# Patient Record
Sex: Female | Born: 1980 | Race: Black or African American | Hispanic: No | Marital: Married | Smoking: Never smoker
Health system: Southern US, Community
[De-identification: ages and names within clinical notes are randomized; demographics above are authoritative.]

## PROBLEM LIST (undated history)

## (undated) DIAGNOSIS — Z8759 Personal history of other complications of pregnancy, childbirth and the puerperium: Secondary | ICD-10-CM

## (undated) DIAGNOSIS — O24419 Gestational diabetes mellitus in pregnancy, unspecified control: Secondary | ICD-10-CM

## (undated) DIAGNOSIS — D509 Iron deficiency anemia, unspecified: Secondary | ICD-10-CM

## (undated) DIAGNOSIS — N920 Excessive and frequent menstruation with regular cycle: Secondary | ICD-10-CM

## (undated) DIAGNOSIS — E119 Type 2 diabetes mellitus without complications: Secondary | ICD-10-CM

## (undated) DIAGNOSIS — G4700 Insomnia, unspecified: Secondary | ICD-10-CM

## (undated) DIAGNOSIS — R000 Tachycardia, unspecified: Secondary | ICD-10-CM

## (undated) DIAGNOSIS — Z973 Presence of spectacles and contact lenses: Secondary | ICD-10-CM

## (undated) DIAGNOSIS — O139 Gestational [pregnancy-induced] hypertension without significant proteinuria, unspecified trimester: Secondary | ICD-10-CM

## (undated) DIAGNOSIS — N644 Mastodynia: Secondary | ICD-10-CM

## (undated) DIAGNOSIS — K219 Gastro-esophageal reflux disease without esophagitis: Secondary | ICD-10-CM

## (undated) DIAGNOSIS — O34219 Maternal care for unspecified type scar from previous cesarean delivery: Secondary | ICD-10-CM

## (undated) DIAGNOSIS — L309 Dermatitis, unspecified: Secondary | ICD-10-CM

## (undated) DIAGNOSIS — I10 Essential (primary) hypertension: Secondary | ICD-10-CM

## (undated) DIAGNOSIS — Z8632 Personal history of gestational diabetes: Secondary | ICD-10-CM

## (undated) HISTORY — DX: Type 2 diabetes mellitus without complications: E11.9

## (undated) HISTORY — DX: Tachycardia, unspecified: R00.0

## (undated) HISTORY — PX: WISDOM TOOTH EXTRACTION: SHX21

## (undated) HISTORY — PX: CYST REMOVAL TRUNK: SHX6283

---

## 2012-04-08 LAB — OB RESULTS CONSOLE RUBELLA ANTIBODY, IGM: Rubella: IMMUNE

## 2012-04-08 LAB — OB RESULTS CONSOLE ANTIBODY SCREEN: Antibody Screen: NEGATIVE

## 2012-04-08 LAB — OB RESULTS CONSOLE HIV ANTIBODY (ROUTINE TESTING): HIV: NONREACTIVE

## 2012-04-22 LAB — OB RESULTS CONSOLE GC/CHLAMYDIA: Chlamydia: NEGATIVE

## 2012-06-05 ENCOUNTER — Encounter: Attending: Obstetrics and Gynecology | Admitting: *Deleted

## 2012-06-05 VITALS — Ht 70.0 in | Wt 159.1 lb

## 2012-06-05 DIAGNOSIS — O99810 Abnormal glucose complicating pregnancy: Secondary | ICD-10-CM

## 2012-06-05 DIAGNOSIS — Z713 Dietary counseling and surveillance: Secondary | ICD-10-CM | POA: Insufficient documentation

## 2012-06-06 ENCOUNTER — Encounter: Payer: Self-pay | Admitting: *Deleted

## 2012-06-06 NOTE — Progress Notes (Signed)
  Patient was seen on 06/05/12 for Gestational Diabetes self-management class at the [facility]. The following learning objectives were met by the patient during this course:   States the definition of Gestational Diabetes  States why dietary management is important in controlling blood glucose  Describes the effects each nutrient has on blood glucose levels  Demonstrates ability to create a balanced meal plan  Demonstrates carbohydrate counting   States when to check blood glucose levels  Demonstrates proper blood glucose monitoring techniques  States the effect of stress and exercise on blood glucose levels  States the importance of limiting caffeine and abstaining from alcohol and smoking  Blood glucose monitor given: Accu Chek Nano BG Monitoring Kit Lot # [id number] Exp: 07-27-13 Blood glucose reading: 93 mg/dL  Patient instructed to monitor glucose levels: FBS: 60 - <90 2 hour: <120  *Patient received handouts:  Nutrition Diabetes and Pregnancy  Carbohydrate Counting List  Patient will be seen for follow-up as needed.

## 2012-06-06 NOTE — Patient Instructions (Signed)
Goals:  Check glucose levels per MD as instructed  Follow Gestational Diabetes Diet as instructed  Call for follow-up as needed    

## 2012-08-28 HISTORY — DX: Maternal care for unspecified type scar from previous cesarean delivery: O34.219

## 2012-08-28 NOTE — L&D Delivery Note (Signed)
Delivery Note At 1:54 AM a viable and healthy female was delivered via Vaginal, Spontaneous Delivery (Presentation: Right Occiput Anterior).  APGAR: 6, 7; weight 4 lb 15 oz (2240 g).   Placenta status: Intact, Spontaneous.  Sent to path for IUGR Cord: 3 vessels with the following complications: None. Cord around right leg  Cord pH: none  Anesthesia:  epidural Episiotomy: none Lacerations: right labial minora Suture Repair: 3.0 chromic Est. Blood Loss (mL): 300  Mom to postpartum.  Baby to nursery-stable.  Bolden Hagerman A 10/22/2012, 2:27 AM

## 2012-09-28 ENCOUNTER — Inpatient Hospital Stay (HOSPITAL_COMMUNITY): Admission: AD | Admit: 2012-09-28 | Source: Ambulatory Visit | Admitting: Obstetrics and Gynecology

## 2012-09-30 ENCOUNTER — Encounter (HOSPITAL_COMMUNITY): Payer: Self-pay

## 2012-09-30 ENCOUNTER — Inpatient Hospital Stay (HOSPITAL_COMMUNITY)
Admission: AD | Admit: 2012-09-30 | Discharge: 2012-10-24 | DRG: 774 | Disposition: A | Discharge status: Home / Self Care | Source: Ambulatory Visit | Attending: Obstetrics & Gynecology | Admitting: Obstetrics & Gynecology

## 2012-09-30 DIAGNOSIS — B373 Candidiasis of vulva and vagina: Secondary | ICD-10-CM | POA: Diagnosis not present

## 2012-09-30 DIAGNOSIS — O1490 Unspecified pre-eclampsia, unspecified trimester: Secondary | ICD-10-CM

## 2012-09-30 DIAGNOSIS — IMO0002 Reserved for concepts with insufficient information to code with codable children: Secondary | ICD-10-CM | POA: Diagnosis present

## 2012-09-30 DIAGNOSIS — O1493 Unspecified pre-eclampsia, third trimester: Secondary | ICD-10-CM | POA: Diagnosis present

## 2012-09-30 DIAGNOSIS — O34219 Maternal care for unspecified type scar from previous cesarean delivery: Secondary | ICD-10-CM | POA: Diagnosis not present

## 2012-09-30 DIAGNOSIS — O99814 Abnormal glucose complicating childbirth: Principal | ICD-10-CM | POA: Diagnosis present

## 2012-09-30 DIAGNOSIS — O24410 Gestational diabetes mellitus in pregnancy, diet controlled: Secondary | ICD-10-CM | POA: Diagnosis present

## 2012-09-30 DIAGNOSIS — O365990 Maternal care for other known or suspected poor fetal growth, unspecified trimester, not applicable or unspecified: Secondary | ICD-10-CM | POA: Diagnosis present

## 2012-09-30 DIAGNOSIS — B3731 Acute candidiasis of vulva and vagina: Secondary | ICD-10-CM | POA: Diagnosis not present

## 2012-09-30 DIAGNOSIS — O2390 Unspecified genitourinary tract infection in pregnancy, unspecified trimester: Secondary | ICD-10-CM | POA: Diagnosis not present

## 2012-09-30 HISTORY — DX: Gestational (pregnancy-induced) hypertension without significant proteinuria, unspecified trimester: O13.9

## 2012-09-30 HISTORY — DX: Gestational diabetes mellitus in pregnancy, unspecified control: O24.419

## 2012-09-30 LAB — RPR: RPR Ser Ql: NONREACTIVE

## 2012-09-30 LAB — CBC
MCH: 29.5 pg (ref 26.0–34.0)
MCHC: 34.6 g/dL (ref 30.0–36.0)
MCV: 85 fL (ref 78.0–100.0)
Platelets: 166 10*3/uL (ref 150–400)
RDW: 13.8 % (ref 11.5–15.5)

## 2012-09-30 LAB — GLUCOSE, CAPILLARY

## 2012-09-30 LAB — URINALYSIS, ROUTINE W REFLEX MICROSCOPIC
Glucose, UA: NEGATIVE mg/dL
Leukocytes, UA: NEGATIVE
Protein, ur: 100 mg/dL — AB
Specific Gravity, Urine: 1.02 (ref 1.005–1.030)
Urobilinogen, UA: 0.2 mg/dL (ref 0.0–1.0)

## 2012-09-30 LAB — COMPREHENSIVE METABOLIC PANEL
AST: 19 U/L (ref 0–37)
Albumin: 2.2 g/dL — ABNORMAL LOW (ref 3.5–5.2)
BUN: 6 mg/dL (ref 6–23)
Calcium: 9 mg/dL (ref 8.4–10.5)
Creatinine, Ser: 0.48 mg/dL — ABNORMAL LOW (ref 0.50–1.10)

## 2012-09-30 LAB — URIC ACID: Uric Acid, Serum: 5.4 mg/dL (ref 2.4–7.0)

## 2012-09-30 LAB — OB RESULTS CONSOLE GBS: GBS: NEGATIVE

## 2012-09-30 MED ORDER — PRENATAL MULTIVITAMIN CH
1.0000 | ORAL_TABLET | Freq: Every day | ORAL | Status: DC
  Administered 2012-09-30 – 2012-10-20 (×21): 1 via ORAL
  Filled 2012-09-30 (×22): qty 1

## 2012-09-30 MED ORDER — MAGNESIUM SULFATE BOLUS VIA INFUSION
6.0000 g | Freq: Once | INTRAVENOUS | Status: AC
  Administered 2012-09-30: 6 g via INTRAVENOUS
  Filled 2012-09-30: qty 500

## 2012-09-30 MED ORDER — DEXTROSE 5 % IV SOLN
500.0000 mg | INTRAVENOUS | Status: DC
  Filled 2012-09-30: qty 500

## 2012-09-30 MED ORDER — INSULIN ASPART 100 UNIT/ML ~~LOC~~ SOLN
7.0000 [IU] | Freq: Every day | SUBCUTANEOUS | Status: DC
  Administered 2012-09-30 – 2012-10-11 (×12): 7 [IU] via SUBCUTANEOUS
  Administered 2012-10-12: 100 [IU] via SUBCUTANEOUS
  Administered 2012-10-13 – 2012-10-20 (×8): 7 [IU] via SUBCUTANEOUS

## 2012-09-30 MED ORDER — AZITHROMYCIN 500 MG PO TABS: 500.0000 mg | ORAL_TABLET | Freq: Every day | ORAL | Status: DC

## 2012-09-30 MED ORDER — SODIUM CHLORIDE 0.9 % IV SOLN: 250.0000 mL | INTRAVENOUS | Status: DC | PRN

## 2012-09-30 MED ORDER — INSULIN ASPART 100 UNIT/ML ~~LOC~~ SOLN
9.0000 [IU] | Freq: Every day | SUBCUTANEOUS | Status: DC
  Administered 2012-09-30 – 2012-10-06 (×7): 9 [IU] via SUBCUTANEOUS
  Administered 2012-10-07: 13:00:00 via SUBCUTANEOUS
  Administered 2012-10-08 – 2012-10-20 (×13): 9 [IU] via SUBCUTANEOUS

## 2012-09-30 MED ORDER — LABETALOL HCL 100 MG PO TABS
100.0000 mg | ORAL_TABLET | Freq: Two times a day (BID) | ORAL | Status: DC
  Administered 2012-09-30 – 2012-10-09 (×19): 100 mg via ORAL
  Filled 2012-09-30 (×19): qty 1

## 2012-09-30 MED ORDER — DEXTROSE 5 % IV SOLN
2.5000 10*6.[IU] | INTRAVENOUS | Status: DC
  Filled 2012-09-30: qty 2.5

## 2012-09-30 MED ORDER — PENICILLIN G POTASSIUM 5000000 UNITS IJ SOLR
5.0000 10*6.[IU] | Freq: Once | INTRAVENOUS | Status: DC
  Filled 2012-09-30: qty 5

## 2012-09-30 MED ORDER — CALCIUM CARBONATE ANTACID 500 MG PO CHEW: 2.0000 | CHEWABLE_TABLET | ORAL | Status: DC | PRN

## 2012-09-30 MED ORDER — DOCUSATE SODIUM 100 MG PO CAPS
100.0000 mg | ORAL_CAPSULE | Freq: Every day | ORAL | Status: DC
  Administered 2012-09-30 – 2012-10-20 (×12): 100 mg via ORAL
  Filled 2012-09-30 (×16): qty 1

## 2012-09-30 MED ORDER — MAGNESIUM SULFATE 40 G IN LACTATED RINGERS - SIMPLE
2.0000 g/h | INTRAVENOUS | Status: DC
  Administered 2012-10-01: 2 g/h via INTRAVENOUS
  Filled 2012-09-30 (×2): qty 500

## 2012-09-30 MED ORDER — HEPARIN SODIUM (PORCINE) 5000 UNIT/ML IJ SOLN
5000.0000 [IU] | Freq: Three times a day (TID) | INTRAMUSCULAR | Status: DC
  Administered 2012-09-30 – 2012-10-20 (×60): 5000 [IU] via SUBCUTANEOUS
  Filled 2012-09-30 (×65): qty 1

## 2012-09-30 MED ORDER — LABETALOL HCL 5 MG/ML IV SOLN
10.0000 mg | Freq: Once | INTRAVENOUS | Status: AC
  Administered 2012-09-30: 10 mg via INTRAVENOUS
  Filled 2012-09-30: qty 4

## 2012-09-30 MED ORDER — LACTATED RINGERS IV SOLN
INTRAVENOUS | Status: DC
  Administered 2012-09-30 – 2012-10-01 (×3): via INTRAVENOUS

## 2012-09-30 MED ORDER — INSULIN ASPART 100 UNIT/ML ~~LOC~~ SOLN
5.0000 [IU] | Freq: Every day | SUBCUTANEOUS | Status: DC
  Administered 2012-10-01 – 2012-10-20 (×20): 5 [IU] via SUBCUTANEOUS

## 2012-09-30 MED ORDER — SODIUM CHLORIDE 0.9 % IJ SOLN: 3.0000 mL | INTRAMUSCULAR | Status: DC | PRN

## 2012-09-30 MED ORDER — ACETAMINOPHEN 325 MG PO TABS
650.0000 mg | ORAL_TABLET | ORAL | Status: DC | PRN
  Administered 2012-09-30 – 2012-10-03 (×4): 650 mg via ORAL
  Filled 2012-09-30 (×4): qty 2

## 2012-09-30 MED ORDER — INSULIN NPH (HUMAN) (ISOPHANE) 100 UNIT/ML ~~LOC~~ SUSP
24.0000 [IU] | Freq: Every day | SUBCUTANEOUS | Status: DC
  Administered 2012-09-30 – 2012-10-08 (×9): 24 [IU] via SUBCUTANEOUS
  Filled 2012-09-30: qty 10

## 2012-09-30 MED ORDER — ZOLPIDEM TARTRATE 5 MG PO TABS
5.0000 mg | ORAL_TABLET | Freq: Every evening | ORAL | Status: DC | PRN
  Administered 2012-09-30 – 2012-10-20 (×4): 5 mg via ORAL
  Filled 2012-09-30 (×5): qty 1

## 2012-09-30 MED ORDER — SODIUM CHLORIDE 0.9 % IJ SOLN: 3.0000 mL | Freq: Two times a day (BID) | INTRAMUSCULAR | Status: DC

## 2012-09-30 NOTE — H&P (Signed)
Connie [name] is a 32 y.o. female presenting for  admission due to elevated BP and proteinuria @ [redacted] wk gestation. Pt was seen in office today for routine care. Pt c/o leg swelling and had a 5 lb weight gain in 6 days. Pt denies any h/a or epigastric pain. Previous hx notable for severe PEC, Class a1 GDM @ 27 5/7 weeks with delivery by C/S. [facility] complicated by Class A2 GDM on insulin. Pt noted to have elevated BP on last office visit  and had PIH labs which were normal.  Pt was instructed to do 24 hr UTP/ crCL collection which today showed 24 hr UTP of 1546mg    Maternal Medical History:  Fetal activity: Perceived fetal activity is normal.    Prenatal complications: Pre-eclampsia.     OB History    Grav Para Term Preterm Abortions TAB SAB Ect Mult Living   2 1  1      1      Past Medical History  Diagnosis Date  . Diabetes mellitus without complication   . Gestational diabetes   . Pregnancy induced hypertension    Past Surgical History  Procedure Date  . Cesarean section   . Cyst removal trunk    Family History: family history is negative for Other. Social History:  reports that she has never smoked. She does not have any smokeless tobacco history on file. She reports that she does not drink alcohol or use illicit drugs.   Prenatal Transfer Tool  Maternal Diabetes: Yes:  Diabetes Type:  Insulin/Medication controlled Genetic Screening: Normal Maternal Ultrasounds/Referrals: Normal Fetal Ultrasounds or other Referrals:  Fetal echonormal Maternal Substance Abuse:  No Significant Maternal Medications:  Meds include: Other: insulin Significant Maternal Lab Results:  None Other Comments:  None  Review of Systems  All other systems reviewed and are negative.   leg swelling. H./a resolved now  Dilation:  (deferred, sent from office) Blood pressure 168/99, pulse 75, temperature 97.9 F (36.6 C), temperature source Oral, resp. rate 16, height 5' 10.5" (1.791 m), weight 81.738 kg  (180 lb 3.2 oz), SpO2 100.00%. Maternal Exam:  Abdomen: Patient reports no abdominal tenderness. Surgical scars: low transverse.   Fetal presentation: vertex  Introitus: Normal vulva. Pelvis: adequate for delivery.      Fetal Exam Fetal Monitor Review: Mode: hand-held doppler probe.   Variability: minimal (<5 bpm).      Tracing: baseline 150  Min variability. Small accels to 160 NR Physical Exam  Constitutional: She is oriented to person, place, and time. She appears well-developed and well-nourished.  HENT:  Head: Atraumatic.  Eyes: EOM are normal.  Neck: Neck supple.  Cardiovascular: Regular rhythm.   Respiratory: Breath sounds normal.  GI: Soft.  Musculoskeletal: She exhibits edema.  Neurological: She is alert and oriented to person, place, and time. She has normal reflexes.  Skin: Skin is warm and dry.  Psychiatric: She has a normal mood and affect.    Prenatal labs: ABO, Rh:  A positive Antibody:  neg Rubella:  Immune RPR:   NR HBsAg:   neg HIV:   neg GBS:   unknown  Assessment/Plan: PIH/preeclampsia Class A2 GDM Previous C/S Hx PTB IUP @ 34 weeks P) admit. Start labetalol. Magnesium sulfate. EFW, BPP. NICU consult. MFM consult( regarding timing of delivery in light of GDM). GBS cx. BS monitoring.  ? BMZ now or closer to delivery. Bedrest.    Kanna Dafoe A 09/30/2012, 12:49 PM

## 2012-09-30 NOTE — Plan of Care (Signed)
Problem: Consults Goal: Birthing Suites Patient Information Press F2 to bring up selections list   Pt < [redacted] weeks EGA     

## 2012-09-30 NOTE — MAU Note (Signed)
Patient states she was seen in the office this am and sent to MAU for evaluation of elevated blood pressure. Patient denies bleeding, leaking or contractions. Reports good fetal movement. States she has had slight headache and swelling over the weekend.

## 2012-09-30 NOTE — Consult Note (Signed)
[facility]  Neonatal Medicine Consultation       09/30/2012    9:51 PM  I was called at the request of the patient's obstetrician (Dr. Cherly Hensen) to speak to this patient due to pregnancy-induced hypertension and insulin-dependent diabetes at [redacted] weeks gestation.  I discussed with the patient the expected outcome for 34-[redacted] week gestations, including mortality and morbidity.  We would expect the baby to survive and have a good chance at normal long-term outcome.  She had a previous 27-week delivery in [place] that required NICU stay.  I answered her questions.  _____________________ Electronically Signed By: Angelita Ingles, MD Neonatologist

## 2012-10-01 ENCOUNTER — Inpatient Hospital Stay (HOSPITAL_COMMUNITY)

## 2012-10-01 ENCOUNTER — Encounter (HOSPITAL_COMMUNITY)

## 2012-10-01 DIAGNOSIS — O24410 Gestational diabetes mellitus in pregnancy, diet controlled: Secondary | ICD-10-CM | POA: Diagnosis present

## 2012-10-01 DIAGNOSIS — O1493 Unspecified pre-eclampsia, third trimester: Secondary | ICD-10-CM | POA: Diagnosis present

## 2012-10-01 LAB — URIC ACID: Uric Acid, Serum: 5.8 mg/dL (ref 2.4–7.0)

## 2012-10-01 LAB — CBC
Hemoglobin: 11.1 g/dL — ABNORMAL LOW (ref 12.0–15.0)
MCH: 28.6 pg (ref 26.0–34.0)
MCHC: 33.1 g/dL (ref 30.0–36.0)

## 2012-10-01 LAB — COMPREHENSIVE METABOLIC PANEL
ALT: 9 U/L (ref 0–35)
BUN: 6 mg/dL (ref 6–23)
Calcium: 7.7 mg/dL — ABNORMAL LOW (ref 8.4–10.5)
Creatinine, Ser: 0.63 mg/dL (ref 0.50–1.10)
GFR calc Af Amer: >90 mL/min (ref >90)
Glucose, Bld: 124 mg/dL — ABNORMAL HIGH (ref 70–99)
Sodium: 136 mEq/L (ref 135–145)
Total Protein: 5.6 g/dL — ABNORMAL LOW (ref 6.0–8.3)

## 2012-10-01 LAB — GLUCOSE, CAPILLARY: Glucose-Capillary: 176 mg/dL — ABNORMAL HIGH (ref 70–99)

## 2012-10-01 NOTE — Progress Notes (Signed)
[facility] ultrasound  Indication: 32 yr old G23P0101 at [redacted]w[redacted]d with mild preeclampsia and gestational diabetes A2 for fetal ultrasound and consult.  Findings: 1. Single intrauterine pregnancy. 2. Estimated fetal weight is in the 30th%. 3. Posterior placenta without evidence of previa. 4. Normal amniotic fluid index. 5. The limited anatomy survey is normal. 6. Normal biophysical profile of 8/8.  Recommendations: 1. Appropriate fetal growth. 2. Normal limited anatomy survey. 3. Normal BPP. 4. Preeclampsia: - see consult note 5. gestational diabetes: - see consult note  Eulis Foster, MD

## 2012-10-01 NOTE — Consult Note (Signed)
MFM consult  32 yr old G2P0101 at 105w1d with mild preeclampsia and gestational diabetes A2 referred by Dr. Cherly Hensen for fetal ultrasound and consult.  Patient was recently admitted after having a mild range blood pressure in the office. 24 hour urine done was positive. Blood pressures initially were in mild range with 2 isolated severe range blood pressures. Patient was started on labetalol and magnesium sulfate. Patient is asymptomatic.  PMH: none Past OB hx: delivered at 27 weeks via C section for severe preeclampsia  Medication: labetalol 100mg  bid; insulin  Ultrasound today shows: estimated fetal weight is in the 30th%. Posterior placenta without evidence of previa. Normal amniotic fluid index. The limited anatomy survey is normal. Normal biophysical profile of 8/8.   Labs: normal LFTs, CBC, creatinine; 24 hour urine 1546mg   I counseled the patient as follows: 1. Appropriate fetal growth.  2. Normal limited anatomy survey.  3. Normal BPP.  4. Preeclampsia:  - at this point patient has mild preeclampsia- did have 2 severe range blood pressures but not separated by 6 hours; patient was then started on labetalol so this confuses the picture somewhat - however, given blood pressures in the last 24 hours have been normal to mild range do not feel that delivery is indicated at this time - expectant management is recommended; however, given history and patient's episode of severe range blood pressure recommend management as an inpatient until delivery - Discussed risks of preeclampsia include: risk of fetal growth restriction, abruption, stillbirth, and risk of severe preeclampsia/eclampsia (15-25% risk) - recommend close surveillance for the development of signs/symptoms of preeclampsia- currently asymptomatic today - recommend twice weekly lab studies: CBC, AST, ALT, uric acid, BUN, creatinine - do not recommend repeating 24 hour urine - recommend at least twice daily assessment of fetal well  being with a nonstress test - recommend delivery at [redacted] weeks gestation or sooner if there is nonreassuring fetal testing, or if patient has any signs/symptoms of severe preeclampsia (if meets that criteria recommend immediate delivery) - recommend close monitoring of blood pressures; can continue labetalol and recommend do not titrate medication further and deliver for persistent severe range blood pressures (SBP greater than or equal to 160 or DBP greater than or equal to 110) or sooner if has symptoms, nonreassuring fetal testing, abnormal labs, or meets severe criteria - fetus appropriately grown on today's ultrasound  - would recommend magnesium sulfate for seizure prophylaxis during labor and 24 hours postpartum if patient meets criteria for severe preeclampsia. The data is more mixed on the need for magnesium sulfate for seizure prophylaxis for mild preeclampsia usually my recommendation is for treatment with magnesium sulfate for seizure prophylaxis for patients with severe preeclampsia. - at this time given patient is [redacted]w[redacted]d would recommend course of betamethasone; with careful monitoring of blood sugars - do not feel patient needs to be on magnesium sulfate at this time given 24 hours have passed and patient is stable  5. gestational diabetes:  Discussed increased risks in pregnancy include: fetal macrosomia, shoulder dystocia, and increased risk of requiring a Cesarean delivery. There is also an increased risk of developing preeclampsia during the pregnancy and an increased risk of type II diabetes in the future. I discussed there is an increased risk of stillbirth, neonatal hypoglycemia, neonatal jaundice, and neonatal electrolyte disturbances. I recommend strict glucose control maintaining fasting blood sugars <90 and 2 hour postprandial values <120.  Insulin may need to be adjusted after giving betamethasone. I recommend screening for diabetes 6 weeks  postpartum.  If patient plans future  pregnancies would recommend treatment with low dose aspirin 81mg  starting in the first or early second trimester as use in high risk patients has been shown to reduce risk of preeclampsia in future pregnancies.  I discussed the above with Dr. Cherly Hensen.  I spent 40 minutes in face to face consultation with the patient in addition to time spent on the ultrasound.  Eulis Foster, MD

## 2012-10-01 NOTE — Progress Notes (Signed)
Day 2 34 1/[redacted] weeks gestation Preeclampsia Hx PTB Class A2 GDM Previous C/S  S: no complaints. Notes leg swelling down. Denies h/a, visual changes or epigastric pain  VS: BP 128/81 (BP 113-147/81-95) Lungs clear to A Cor RRR Abd: gravid, nontender pfannensteil Pelvic deferred BS 71-90 Tracing: baseline 140-145 small accels (+) 150 (-0ctx  IMP: Class A2 GDM in insulin w/ better glucose numbers due to prob less dietary indiscretion Preeclampsia on labetalol, Magnesium Hx PTB Prev LTCS IUP @ 34 1/7 weeks  P)d/c magnesium. Hold on betamethasone. Await EFW, BPP, MFM input. Cont labetalol, repeat PIH labs today

## 2012-10-01 NOTE — Progress Notes (Signed)
Antenatal Nutrition Assessment:  Currently  34 1/[redacted] weeks gestation, with preeclampsia, IDGDM. Height  70" Weight 180 Lbs pre-pregnancy weight 145 Lbs.Pre-pregnancy  BMI 20.9  IBW 150 Lbs  Total weight gain 35 Lbs. Weight gain goals 25-35 Lbs.   Estimated needs: 2000-2200 kcal/day, 72-82 grams protein/day, 2.3  liters fluid/day  Carbohydrate modified gestational  diet  Current diet prescription will provide for increased needs.  Nutrition related labs.  CBG (last 3)   Basename 10/01/12 1103 10/01/12 0632 09/30/12 2118  GLUCAP 176* 90 97     Nutrition Dx: Increased nutrient needs r/t pregnancy and fetal growth requirements aeb [redacted] weeks gestation.  No educational needs assessed at this time. Pt is followed by Dr. Talmage Nap, Education at [facility] on 06/15/12  Inez Pilgrim.Odis Luster LDN Neonatal Nutrition Support Specialist Pager [phone]

## 2012-10-02 LAB — GLUCOSE, CAPILLARY: Glucose-Capillary: 117 mg/dL — ABNORMAL HIGH (ref 70–99)

## 2012-10-02 NOTE — Progress Notes (Signed)
34 2/7 weeks   D3 Preeclampsia on labetalol Hx PTB Previous C/S Class A2 GDM on insulin  S: notes no further leg swelling (+) FM (-) h/a or epigastric pain VS BP 134/78 Afebrile (BP 123/91- 157/89) Lungs clear to A Cor RRR Abd gravid, soft nontender Pelvic deferred Extr> no edema or calf tenderness  sono ( 2/4) 4lb 7 oz( 30%)  Nl AFI BPP 8/8 Tracing: baseline 135-140 (+) accels  GBS cx pending.  FBS 84, 2hr  Postprandial 117  IMP: Mild preeclampsia s/p magnesium now on labetalol Previous C/S desires VBAC Hx PTB 2nd to severe preeclampsia Class A2GDM on insulin controlled IUP 2 34 2/7 w/ adequate growth  P) cont inpt status per MFM. Repeat 24 hr UTP/ CRCL Fri, PIH labs Friday. Cont BS check GBS cx

## 2012-10-02 NOTE — Progress Notes (Signed)
Patient up to bathroom, then sitting in chair.

## 2012-10-03 LAB — GLUCOSE, CAPILLARY
Glucose-Capillary: 106 mg/dL — ABNORMAL HIGH (ref 70–99)
Glucose-Capillary: 79 mg/dL (ref 70–99)
Glucose-Capillary: 80 mg/dL (ref 70–99)

## 2012-10-03 LAB — CULTURE, BETA STREP (GROUP B ONLY)

## 2012-10-03 MED ORDER — SODIUM CHLORIDE 0.9 % IJ SOLN
3.0000 mL | Freq: Two times a day (BID) | INTRAMUSCULAR | Status: DC
  Administered 2012-10-03 (×3): 3 mL via INTRAVENOUS

## 2012-10-03 NOTE — Progress Notes (Signed)
S: no complaint. Active fetus Denies h/a, visual changes or epigastric pain Disc need for protein intake  O: VS afebrile BP 142/84  ( BP 142-151/64-103) Lungs clear to A Cor RRR Abd gravid nontender Pelvic deferred Extr (-) edema  Or clonus  Tracing: baseline 140 (+) small accels, rare ctx BS FBS < 90, 2hr < 90  IMP: Preeclampsia on labetalol. 24 hr UTP in prog Class A2 GDM controlled on insulin Prev C/S  Desires VBAC Hx PTB IUP @ 34 3/7 P) cont present mgmt. Watch BP> await  24 hr UTP/crCL. PIH labs in am

## 2012-10-04 LAB — CBC
HCT: 36.6 % (ref 36.0–46.0)
MCH: 29.4 pg (ref 26.0–34.0)
MCV: 86.7 fL (ref 78.0–100.0)
Platelets: 159 10*3/uL (ref 150–400)
RBC: 4.22 MIL/uL (ref 3.87–5.11)

## 2012-10-04 LAB — URIC ACID: Uric Acid, Serum: 6.2 mg/dL (ref 2.4–7.0)

## 2012-10-04 LAB — COMPREHENSIVE METABOLIC PANEL
AST: 20 U/L (ref 0–37)
BUN: 10 mg/dL (ref 6–23)
CO2: 23 mEq/L (ref 19–32)
Calcium: 8.8 mg/dL (ref 8.4–10.5)
Chloride: 101 mEq/L (ref 96–112)
Creatinine, Ser: 0.68 mg/dL (ref 0.50–1.10)
GFR calc Af Amer: >90 mL/min (ref >90)
GFR calc non Af Amer: >90 mL/min (ref >90)
Total Bilirubin: 0.2 mg/dL — ABNORMAL LOW (ref 0.3–1.2)

## 2012-10-04 LAB — GLUCOSE, CAPILLARY
Glucose-Capillary: 68 mg/dL — ABNORMAL LOW (ref 70–99)
Glucose-Capillary: 105 mg/dL — ABNORMAL HIGH (ref 70–99)

## 2012-10-04 LAB — CREATININE CLEARANCE, URINE, 24 HOUR
Creatinine, Urine: 36.84 mg/dL
Creatinine: 0.63 mg/dL (ref 0.50–1.10)
Urine Total Volume-CRCL: 3425 mL

## 2012-10-04 LAB — PROTEIN, URINE, 24 HOUR
Collection Interval-UPROT: 24 hours
Protein, 24H Urine: 719 mg/d — ABNORMAL HIGH (ref 50–100)

## 2012-10-04 NOTE — Progress Notes (Signed)
Nutrition Follow-up Pt c/o being very hungry, limited protein options at breakfast that she can tolerated without nausea. CBG's have been wnl. Gave pt menu for ordering snacks, encouraged her to order double protein portions at meals and snacks to increase satiety and caloric intake. CHO limit at breakfast increased from 35 to 40 grams to allow more menu options at breakfast. CBG (last 3)   Basename 10/04/12 0602 10/03/12 2009 10/03/12 1529  GLUCAP 68* 80 76    [address] M.Odis Luster LDN Neonatal Nutrition Support Specialist Pager [phone]

## 2012-10-04 NOTE — Progress Notes (Addendum)
Patient ID: Connie [name], female   DOB: 1981-08-17, 32 y.o.   MRN: [id number] Hospital Day #4  S: Feels well.  Good FM.  Denies blurred vision, HA, epigastric pain, CP, SOB  O: A&O x 3 Lungs: CTA Heart: RRR ABD: Soft, gravid, FHR 145 - 150; Denies feeling UC's; rare UC noted on fetal monitor strip Vag: Denies leaking of fluid, bleeding, or discharge Extremities: no edema and no calf pain  LABS: Ur Acid: 6.2 Hgb: 12.4/ Hct: 36.6 Plt: 159 Liver enzymes WNL 24hr urine: Cr Cl 139; UOP 3400 in 24 hrs; protein pending FBS: 66  A/P: 34 wks PEC GDM A2 Elevated Cr Clearance, protein pending MD notified of lab results and will see pt today.   Connie [name], [facility]  10/04/12 12:22

## 2012-10-05 LAB — GLUCOSE, CAPILLARY
Glucose-Capillary: 85 mg/dL (ref 70–99)
Glucose-Capillary: 100 mg/dL — ABNORMAL HIGH (ref 70–99)

## 2012-10-05 NOTE — Progress Notes (Signed)
34 5/7 weeks Mild preeclampsia Hx PTB Prev C/S Class A2 GDM S: (+) FM denies ctx  O; BP 129/91 P90 98.1 Lungs clear to A Cor RRR Abd: gravid nontender Pelvic deferred Extr" no edema DTR 2+  Tracing: baseline 135-140 (+) 150 (-)ctx  UTP 719 mg PI H labs nl uric acid 6.2 BS wnl FBS 85  2 hr 100  A/P: Preeclampsia on labetalol Class A2 GDM controlled o insulin Hx PTB 2nd to severe preeclampsia IUP @ 34 5/7 weeks P) cont inpt mgmt per MFM. Check cervix @ 35 weeks

## 2012-10-06 LAB — GLUCOSE, CAPILLARY
Glucose-Capillary: 96 mg/dL (ref 70–99)
Glucose-Capillary: 113 mg/dL — ABNORMAL HIGH (ref 70–99)
Glucose-Capillary: 80 mg/dL (ref 70–99)

## 2012-10-06 NOTE — Progress Notes (Signed)
Hospital day # 6 pregnancy at [redacted]w[redacted]d for: Surveillance of severe Pre Eclampsia and  Hx of PTB  S: Denies complaint Pressure and PT Ctx.b  Rested over night.        O: BP 133/82  Pulse 89  Temp(Src) 98.3 F (36.8 C) (Oral)  Resp 20  Ht 5\' 10"  (1.778 m)  Wt 174 lb (78.926 kg)  BMI 24.97 kg/m2  SpO2 100%       FHT:  145 bpm, baseline.Cat 2 tracing with minimal variability.  UC:   None EFM continuous SVE:   Deferred  as plan to have SVE at 35 weeks to day [redacted]w[redacted]d  General: alert, cooperative and no distress Breasts: N/T Heart: RRR Lungs: CTAB Abdomen: BS x4, soft, non-tender, gravid to dates DVT Evaluation: No evidence of DVT seen on physical exam. DTR's +1  Upper and Lower limbs bilaterally No headache/ blurred visit Negative Homan's sign. No cords or calf tenderness. No significant calf/ankle edema. Collaborative management with MFM  . Results for orders placed during the hospital encounter of 09/30/12 (from the past 24 hour(s))  GLUCOSE, CAPILLARY     Status: Abnormal   Collection Time    10/05/12 11:00 AM      Result Value Range   Glucose-Capillary 100 (*) 70 - 99 mg/dL   Comment 1 Documented in Chart    GLUCOSE, CAPILLARY     Status: None   Collection Time    10/05/12  3:06 PM      Result Value Range   Glucose-Capillary 75  70 - 99 mg/dL   Comment 1 Documented in Chart    GLUCOSE, CAPILLARY     Status: Abnormal   Collection Time    10/05/12  7:58 PM      Result Value Range   Glucose-Capillary 139 (*) 70 - 99 mg/dL  GLUCOSE, CAPILLARY     Status: Abnormal   Collection Time    10/06/12  5:54 AM      Result Value Range   Glucose-Capillary 66 (*) 70 - 99 mg/dL  GLUCOSE, CAPILLARY     Status: None   Collection Time    10/06/12  6:16 AM      Result Value Range   Glucose-Capillary 96  70 - 99 mg/dL    A: [redacted]w[redacted]d with Condition of severe Pre- Eclampsia and PTB     Stable under present management plan.  P: Continue current management plan as inpatient at  hospital in collaboration with MFM.     Plan for Cx assessment at 35 weeks (tomorrow)    Dr. Cherly Hensen Primary will follow.  Earl Gala , CNM. 10/06/2012 9:18 AM

## 2012-10-07 LAB — GLUCOSE, CAPILLARY: Glucose-Capillary: 55 mg/dL — ABNORMAL LOW (ref 70–99)

## 2012-10-07 LAB — ABO/RH: ABO/RH(D): A POS

## 2012-10-07 LAB — TYPE AND SCREEN

## 2012-10-07 NOTE — Progress Notes (Signed)
Ur chart review completed.  

## 2012-10-07 NOTE — Progress Notes (Signed)
S: 35 wk Preeclampsia Prev C/S Class A2GDM on insulin Prev PTB  No complaint. Good FM (-) ctx. Denies PIH warning signs O: VS BP 120/73 Afebrile Lungs clear to A Cor RRR Abd gravid nontender VE: closed/long/oop Extr: no edema or calf tenderness DTR 1+  Labs( 2/9) 96/113/98/80 2/10 FBS 55 Tracing; cat 1 (-)ctx GBS cx neg  IMP; preeclampsia on labetalol w/ prev hx of severe PET Class A2 GDM on insulin IUP @ 35 week Prev C/S desires VBAC  Hx PTB P) cont present mgmt. Watch BS regarding hypoglycemia . Disc if cervix remains unfavorable would not be a candidate for VBAC. NST 2x/d

## 2012-10-08 LAB — GLUCOSE, CAPILLARY
Glucose-Capillary: 57 mg/dL — ABNORMAL LOW (ref 70–99)
Glucose-Capillary: 100 mg/dL — ABNORMAL HIGH (ref 70–99)
Glucose-Capillary: 103 mg/dL — ABNORMAL HIGH (ref 70–99)
Glucose-Capillary: 90 mg/dL (ref 70–99)

## 2012-10-08 NOTE — Progress Notes (Signed)
HD # 8 antepartum note See per request Dr Cherly Hensen  S:  Reports feeling well             Tolerating po/ No nausea or vomiting             PIH symptoms             Up ad lib / ambulatory  Fetal movement   O:               VS: BP 139/89  Pulse 73  Temp(Src) 98.4 F (36.9 C) (Oral)  Resp 18  Ht 5\' 10"  (1.778 m)  Wt 78.155 kg (172 lb 4.8 oz)  BMI 24.72 kg/m2  SpO2 100%              FBS 57 this am / post-prandial 93-155 with most in 100s              Physical Exam:             Alert and oriented X3  Lungs: Clear and unlabored  Heart: regular rate and rhythm / no mumurs  Abdomen: soft, non-tender, non-distended              Uterus: firm, non-tender  Extremities: no edema, no calf pain or tenderness    A: 35 weeks 1/7 preeclampsia             GDM-A2 - insulin w/ stable glucose levels  Doing well - stable status  P:  Continue current management  Connie [name] CNM, MSN2/06/2013, 12:50 PM

## 2012-10-09 LAB — GLUCOSE, CAPILLARY: Glucose-Capillary: 103 mg/dL — ABNORMAL HIGH (ref 70–99)

## 2012-10-09 MED ORDER — INSULIN NPH (HUMAN) (ISOPHANE) 100 UNIT/ML ~~LOC~~ SUSP
22.0000 [IU] | Freq: Every day | SUBCUTANEOUS | Status: DC
  Administered 2012-10-09 – 2012-10-20 (×12): 22 [IU] via SUBCUTANEOUS
  Filled 2012-10-09: qty 10

## 2012-10-09 MED ORDER — LABETALOL HCL 200 MG PO TABS
200.0000 mg | ORAL_TABLET | Freq: Two times a day (BID) | ORAL | Status: DC
  Administered 2012-10-09 – 2012-10-13 (×8): 200 mg via ORAL
  Filled 2012-10-09 (×8): qty 1

## 2012-10-09 NOTE — Progress Notes (Signed)
35.2/7 weeks Preeclampsia Hx PTB Prev C/S Class A2 GDM, on Insulin  S: No HA/vision changes. No swelling. No contractions/blding/leaking fluid. (+) good FMs.   BP 144/102  Pulse 91  Temp(Src) 97.8 F (36.6 C) (Oral)  Resp 18  Ht 5\' 10"  (1.778 m)  Wt 170 lb 6.4 oz (77.293 kg)  BMI 24.45 kg/m2  SpO2 100% BP is trending up (currently on Labetalol 100mg  bid) BS F low normal and PP in range  Lungs clear bilateral CV RRR Abd: gravid non tender, relaxed uterus, FMs felt. Pelvic deferred Extr no edema or calf tenderness, DTR 2+  FHT - reactive, baseline 135-140, accels noted, no decels, moderate variablility (-)ctx  A/P: 35.2/7 weeks Preeclampsia on labetalol, increase to 200mg  BID since BPs tending to stay a bit higher Class A2 GDM controlled on insulin, decrease NPH at HS to 22 units Hx PTB 2nd to severe preeclampsia via c/s Dietary to see pt to assist with food choices

## 2012-10-09 NOTE — Progress Notes (Signed)
Pt not eating well co's of poor food choices MD aware and Dietician paged to notifiy of problems

## 2012-10-10 LAB — GLUCOSE, CAPILLARY
Glucose-Capillary: 82 mg/dL (ref 70–99)
Glucose-Capillary: 91 mg/dL (ref 70–99)

## 2012-10-10 NOTE — Plan of Care (Signed)
Pt voicing frustration re: discussion with dietary about her meal requests, some erroneous information about CHO choices.  Spoke with service response, will submit another request for dietary consult.

## 2012-10-10 NOTE — Plan of Care (Signed)
Dietary manager in to speak with patient re: concerns.

## 2012-10-10 NOTE — Progress Notes (Signed)
35.3/7 weeks Preeclampsia Hx PTB Prev C/S Class A2 GDM, on Insulin  S: No HA/vision changes. No swelling. No contractions/blding/leaking fluid. (+) good FMs. No RUQ pain, Pt notes increased labetalol dose did not start until last pm.   BP 132/93  Pulse 91  Temp(Src) 97.3 F (36.3 C) (Oral)  Resp 18  Ht 5\' 10"  (1.778 m)  Wt 77.293 kg (170 lb 6.4 oz)  BMI 24.45 kg/m2  SpO2 100% BP still elevated yest afternoon but did not increase dose until pm BS reviewed. FBS nl, post-break today at 130 Lungs clear bilateral CV RRR Abd: gravid non tender, relaxed uterus, FMs felt. Pelvic deferred Extr no edema or calf tenderness, DTR 1+  FHT - reactive, baseline 135-140, accels noted, no decels, moderate variablility (-)ctx  A/P: 3532/7 weeks Preeclampsia on labetalol, increase to 200mg  BID last night, am bp's look better, will cont close observation, may need to increase to tid. Weekly PIH labs due tomorrow, will order Class A2 GDM controlled on insulin, decreased NPH at HS to 22 units last night. Will watch BS today. Hx PTB 2nd to severe preeclampsia via c/s  Connie [name] A. 10/10/2012 11:13 AM

## 2012-10-11 LAB — CBC
MCH: 29.7 pg (ref 26.0–34.0)
MCHC: 34 g/dL (ref 30.0–36.0)
Platelets: 192 10*3/uL (ref 150–400)
RBC: 4.41 MIL/uL (ref 3.87–5.11)

## 2012-10-11 LAB — COMPREHENSIVE METABOLIC PANEL
ALT: 11 U/L (ref 0–35)
AST: 17 U/L (ref 0–37)
Calcium: 9.1 mg/dL (ref 8.4–10.5)
Sodium: 133 mEq/L — ABNORMAL LOW (ref 135–145)
Total Protein: 6.1 g/dL (ref 6.0–8.3)

## 2012-10-11 LAB — GLUCOSE, CAPILLARY: Glucose-Capillary: 80 mg/dL (ref 70–99)

## 2012-10-11 LAB — TYPE AND SCREEN: Antibody Screen: NEGATIVE

## 2012-10-11 NOTE — Progress Notes (Signed)
Hospital day # 11 pregnancy at [redacted]w[redacted]d  Mild PreEclampsia/GDMA2 Insulin  S: well, No PIH Sx including no H/A, no visual Sx, no epigastric pain.      Reports good fetal activity      Contractions:none      Vaginal bleeding:none now       Vaginal discharge: no significant change  O: BP 137/77  Pulse 77  Temp(Src) 97.1 F (36.2 C) (Oral)  Resp 20  Ht 5\' 10"  (1.778 m)  Wt 77.248 kg (170 lb 4.8 oz)  BMI 24.44 kg/m2  SpO2 100%      Fetal tracings:Fetal heart variability: moderate, accelerations present, no deceleration.  Reactive, reviewed and reassuring.      Uterus gravid and non-tender      Extremities: no significant edema and no signs of DVT                          No clonus  FBS this am 80, PP 121.  A: [redacted]w[redacted]d with Mild PEC, GDMA2 insulin     Stable.  PIH labs today.  Interval growth at 2 wks, due in 4 days.    P: continue current plan of care with monitoring BID.  PIH labs today.  [place] interval growth at 2 wks in 4 days.  Keep NPH at bed time same dose.  Nayelis Bonito,MARIE-LYNE  MD 10/11/2012 1:35 PM

## 2012-10-12 LAB — GLUCOSE, CAPILLARY: Glucose-Capillary: 117 mg/dL — ABNORMAL HIGH (ref 70–99)

## 2012-10-12 NOTE — Progress Notes (Signed)
Hospital day # 12 pregnancy at [redacted]w[redacted]d  Mild PreEclampsia/GDMA2 Insulin  S: Doing well, no complaints. No symptoms including no H/A, no visual changes, no epigastric pain.      Good fetal mvmts, no UCs/ leaking/bleeding.  O: BP 131/83  Pulse 80  Temp(Src) 98.3 F (36.8 C) (Oral)  Resp 20  Ht 5\' 10"  (1.778 m)  Wt 169 lb 11.2 oz (76.975 kg)  BMI 24.35 kg/m2  SpO2 100% BS well controlled, low normal.   Lungs CTA bilateral CV RRR S1S2 nl Abdo soft, gravid uterus, non tender NST- Reactive   Extremities: no significant edema and no signs of DVT, DTR +2/+2  A/P: [redacted]w[redacted]d with Mild PEC, GDMA2 insulin. Stable.   PIH labs 2/14 nl. BP well controlled on Labetalol 200 mg bid. Plan 2wk Interval growth on 10/14/12 with MFM. NST bid.  GDM on Insulin, NPH 22 at bedtime, short acting with meals, good control.  Yoona Ishii R  MD 10/12/2012 10:39 AM

## 2012-10-13 LAB — GLUCOSE, CAPILLARY
Glucose-Capillary: 77 mg/dL (ref 70–99)
Glucose-Capillary: 133 mg/dL — ABNORMAL HIGH (ref 70–99)

## 2012-10-13 MED ORDER — LABETALOL HCL 200 MG PO TABS
200.0000 mg | ORAL_TABLET | Freq: Three times a day (TID) | ORAL | Status: DC
  Administered 2012-10-13 – 2012-10-21 (×26): 200 mg via ORAL
  Filled 2012-10-13 (×29): qty 1

## 2012-10-13 NOTE — Progress Notes (Signed)
Hospital day # 13 pregnancy at [redacted]w[redacted]d  Mild PreEclampsia/GDMA2 Insulin  S: Doing well, no complaints.  No symptoms including no H/A, no visual changes, no epigastric pain. Good fetal mvmts, no UCs/ leaking/bleeding.  O: BP 132/95  Pulse 84  Temp(Src) 98 F (36.7 C) (Oral)  Resp 20  Ht 5\' 10"  (1.778 m)  Wt 169 lb 3.2 oz (76.749 kg)  BMI 24.28 kg/m2  SpO2 100% BP few elevated (systolic <145, diastolic few 90s) BS well controlled.  Abdo soft, gravid uterus, non tender Extremities: no significant edema and no signs of DVT, DTR +2/+2  NST- Reactive    A/P: [redacted]w[redacted]d with Mild PEC, GDMA2 insulin. Stable.   PIH labs 2/14 nl.  Increase Labetalol to 200mg  q 8 hrs.  Plan 2wk Interval growth on 10/14/12 with MFM. NST bid - reactive A2 GDM on Insulin, NPH 22 at bedtime, short acting with meals, good control. Planning IOL at 37 wks, pending exam  Etoile Looman R  MD 10/13/2012 4:38 PM

## 2012-10-14 ENCOUNTER — Inpatient Hospital Stay (HOSPITAL_COMMUNITY)

## 2012-10-14 LAB — GLUCOSE, CAPILLARY
Glucose-Capillary: 76 mg/dL (ref 70–99)
Glucose-Capillary: 151 mg/dL — ABNORMAL HIGH (ref 70–99)
Glucose-Capillary: 115 mg/dL — ABNORMAL HIGH (ref 70–99)

## 2012-10-14 LAB — TYPE AND SCREEN: ABO/RH(D): A POS

## 2012-10-14 NOTE — Progress Notes (Deleted)
Ur chart review completed.  

## 2012-10-14 NOTE — Progress Notes (Signed)
To mfm for ultrasound

## 2012-10-14 NOTE — Progress Notes (Signed)
Hospital day # 14 pregnancy at 36w 0d  Mild PreEclampsia/GDMA2 Insulin  S: Doing well, no complaints.  No symptoms of severe preeclampsia incl H/A, visual changes, epigastric pain. Good fetal mvmts, no UCs/ leaking/bleeding.  O: BP 123/76  Pulse 84  Temp(Src) 98.2 F (36.8 C) (Oral)  Resp 18  Ht 5\' 10"  (1.778 m)  Wt 169 lb 12.8 oz (77.021 kg)  BMI 24.36 kg/m2  SpO2 100% BP improved on Labetalol 200mg  tid BS well controlled.  Abdo soft, gravid uterus, non tender Extremities: no significant edema and no signs of DVT, DTR +2/+2  NST- Reactive    A/P: [redacted]w[redacted]d with Mild PEC, GDMA2 insulin. Stable.   PIH labs 2/14 nl. Labetalol to 200mg  q 8 hrs.  A2 GDM on Insulin, NPH 22 at bedtime, short acting with meals, good control.  Growth sono today- VTX, EFW 5'2" at 17% (down from 30% 2 wks back), AC at 10%, AFI nl.  NST bid - reactive  Planning IOL at 37 wks, pending exam  Lopaka Karge R  MD 10/14/2012 3:25 PM

## 2012-10-14 NOTE — Progress Notes (Signed)
Connie [name]  was seen today for an ultrasound appointment.  See full report in [facility].  Impression: Single IUP at 36 0/7 weeks Mild preeclampsia, A2 GDM Normal interval anatomy The estimated fetal weight today is at the 17th %tile.  [facility] measures at the 10th %tile (asymmetric growth) Normal amniotic fluid volume  Recommendations: Continue inpatient observation / management Delivery at 37 weeks for mild preeclampsia or sooner if criteria for severe preeclampsia met.  Alpha Gula, MD

## 2012-10-14 NOTE — Progress Notes (Signed)
Ur chart review completed.  

## 2012-10-15 ENCOUNTER — Inpatient Hospital Stay (HOSPITAL_COMMUNITY)

## 2012-10-15 LAB — GLUCOSE, CAPILLARY: Glucose-Capillary: 113 mg/dL — ABNORMAL HIGH (ref 70–99)

## 2012-10-15 NOTE — Progress Notes (Signed)
10/15/12 1200  Clinical Encounter Type  Visited With Health care provider Marrion Coy, RN)  Visit Type Other (Comment) (Arrange for pt's church group to gather in chapel)  Referral From Nurse   Received page from Marrion Coy, RN, regarding pt's plan for her church group (ca 10 people) to visit tonight (ca 6pm) to offer prayer support for her.  Connie [name] plans to take pt to chapel for this meeting, in order to provide ample space and seating for visitors in an appropriate, conducive environment.  Evening timing is perfect, because there will be no disruption of clinic waiting room (as clinics are closed at that time).  I phoned security to notify them of plans.  [address] [place], [place] [phone]

## 2012-10-15 NOTE — Progress Notes (Signed)
10/15/12 1500  Clinical Encounter Type  Visited With Patient  Visit Type Spiritual support;Social support  Spiritual Encounters  Spiritual Needs Emotional   Met Connie [name] in person after helping coordinate opportunity for her women's prayer group from her [place] church in [place] to gather with her for prayer support in the chapel this evening.  She was in great spirits, giving thanks for being able to maintain this pregnancy much longer than her first (her son, 20 months, was born at 35 weeks), and feeling confident that her son at home is doing well even with her here.  Provided intro to chaplain services and opportunity to share and process her story.  Connie [name] is aware of ongoing chaplain availability.  Please also page as needed:  [phone].  [address] [place], [place] [phone]

## 2012-10-16 LAB — GLUCOSE, CAPILLARY: Glucose-Capillary: 107 mg/dL — ABNORMAL HIGH (ref 70–99)

## 2012-10-16 NOTE — Progress Notes (Signed)
S: feels good fetal movement Denies PIH warning signs  O: VS Afebrile BP 150/88 Lungs clear to A Cor RRR Abd: gravid nontender Pelvic deferred Extr: no edema  Tracing: baseline 140-145 (+) accels 10 x 10 No ctx  BS:80  IMP: IUP @ 36 2/7 weeks Preeclampsia on labetalol Class A2 GDM Hx PTB Prev C/s desires VBAC P) disc delivery plans for 2/24. Pt would like 3/1 for labor/delivery.. Will check cervix for mode of delivery. Cont close fetal monitor. Await dietician input

## 2012-10-16 NOTE — Progress Notes (Signed)
S: notes decreased FM denies ctx . No h/a, visual changes. C/o hunger  O: BP 152/93 afebrile VE: deferred Lungs clear to A Abd gravid  extr no edema  Tracing: baseline 140 min variability accels 10x 10 BPP 8/8 nl AFI BS: 69/112/83 IMP: Preeclampsia on labetalol Hx PTB Class A2GDM on insulin Prev C/S IUP @ 36 1/7  P) cont input. Recheck cervix . Will have dietician increase portions

## 2012-10-17 LAB — GLUCOSE, CAPILLARY
Glucose-Capillary: 81 mg/dL (ref 70–99)
Glucose-Capillary: 103 mg/dL — ABNORMAL HIGH (ref 70–99)
Glucose-Capillary: 106 mg/dL — ABNORMAL HIGH (ref 70–99)

## 2012-10-17 LAB — TYPE AND SCREEN

## 2012-10-17 MED ORDER — LACTATED RINGERS IV SOLN: INTRAVENOUS | Status: DC

## 2012-10-17 MED ORDER — LACTATED RINGERS IV BOLUS (SEPSIS)
500.0000 mL | Freq: Once | INTRAVENOUS | Status: AC
  Administered 2012-10-17: 500 mL via INTRAVENOUS

## 2012-10-18 LAB — GLUCOSE, CAPILLARY: Glucose-Capillary: 110 mg/dL — ABNORMAL HIGH (ref 70–99)

## 2012-10-18 MED ORDER — NYSTATIN-TRIAMCINOLONE 100000-0.1 UNIT/GM-% EX CREA
TOPICAL_CREAM | Freq: Two times a day (BID) | CUTANEOUS | Status: DC
  Administered 2012-10-18 (×2): via TOPICAL
  Filled 2012-10-18: qty 15

## 2012-10-18 NOTE — Progress Notes (Signed)
S: no complaint (+) FM  Denies h/a, epigastric pain  O: VS Afebrile  BP 153/93, 149/93 Lungs clear to A Cor RRR Abd; gravid nontender Extr: (-) edema or calf tenderness  Tracing; baseline 135 (+) accel 15 x 15 mins occ IU   BS: 94 101 91  IMP: IUP @ 36 4.7 weeks Preeclampsia on labetalol Class A2GDM insulin Hx PTB Prev C/S desires VBAC  P) intracervical ballloon ripening w/ pitocin induction 2/24

## 2012-10-18 NOTE — Progress Notes (Signed)
S; c/o vulvar itching and burning Denies PIH warning signs (+) FM Denies ctx  O:VS BP 125/77 Afebrile Lungs clear to A Cor RRR  Abd gravid nontender Pelvic closed/60-70%/-3 post soft Extr (-) edema  Tracing baseline 135 (+) accels 10 x 10 occ ctx BS 81/106  IMP: Preeclampsia on labetalol Class A2 GDM Prev C/S desire vbac Hx PTB IUP @ 36 3/7 weeks Vulvar candidiasis P) cont  Present mgmt. Cont fetal monitoring. 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

## 2012-10-19 LAB — GLUCOSE, CAPILLARY
Glucose-Capillary: 79 mg/dL (ref 70–99)
Glucose-Capillary: 184 mg/dL — ABNORMAL HIGH (ref 70–99)
Glucose-Capillary: 85 mg/dL (ref 70–99)
Glucose-Capillary: 177 mg/dL — ABNORMAL HIGH (ref 70–99)

## 2012-10-19 NOTE — Progress Notes (Signed)
S: feels fine (+) FM No h/a, visual changes  O: VS Afebrile BP  Lungs clear to A Cor RRR Abd; gravid nontender Pelvic deferred Extr: no edema or  Calf tenderness  Tracing: baseline 140 (+) accels No ctx  BS 77  IMP: preeclampsia on labetalol .Marland Kitchen Some labile BP noted IUP @ 36 5/7 week Class A2 GDM/insulin Hx PTB Prev C/S  P) PIH labs 2/24. Induction 2/24. Cont inpt mgmt

## 2012-10-20 LAB — GLUCOSE, CAPILLARY: Glucose-Capillary: 138 mg/dL — ABNORMAL HIGH (ref 70–99)

## 2012-10-20 LAB — TYPE AND SCREEN

## 2012-10-20 NOTE — Progress Notes (Signed)
S; feels well. Had sweetened tea yesterday with resultant elev BS Denies ctx or PIH warning signs  O: VS BP 146/99  ( BP 123-157/86-94) Lungs clear to A Cor RRR Abd gravid soft nontender Pevic 1/60-70/-3 post lateral soft Extr: no edema or calf tenderness  Tracing; baseline 140 small accels to 150  BS( 2/22) 85/177/79/184 2/23  95/138  IMP: preeclampsia on labetalol Class A2 GDM Prev C/S desires VBAC Hx PTB P) intracervical balloon induction tomorrow. PIH labs in am. Cont present mgmt

## 2012-10-21 ENCOUNTER — Encounter (HOSPITAL_COMMUNITY): Payer: Self-pay | Admitting: *Deleted

## 2012-10-21 ENCOUNTER — Encounter (HOSPITAL_COMMUNITY): Payer: Self-pay | Admitting: Anesthesiology

## 2012-10-21 ENCOUNTER — Inpatient Hospital Stay (HOSPITAL_COMMUNITY): Admitting: Anesthesiology

## 2012-10-21 ENCOUNTER — Inpatient Hospital Stay (HOSPITAL_COMMUNITY): Admit: 2012-10-21

## 2012-10-21 LAB — COMPREHENSIVE METABOLIC PANEL
Alkaline Phosphatase: 190 U/L — ABNORMAL HIGH (ref 39–117)
BUN: 13 mg/dL (ref 6–23)
CO2: 20 mEq/L (ref 19–32)
Chloride: 101 mEq/L (ref 96–112)
GFR calc Af Amer: >90 mL/min (ref >90)
GFR calc non Af Amer: >90 mL/min (ref >90)
Glucose, Bld: 91 mg/dL (ref 70–99)
Potassium: 4.4 mEq/L (ref 3.5–5.1)
Total Bilirubin: 0.3 mg/dL (ref 0.3–1.2)

## 2012-10-21 LAB — MAGNESIUM: Magnesium: 5.1 mg/dL — ABNORMAL HIGH (ref 1.5–2.5)

## 2012-10-21 LAB — CBC
HCT: 41.2 % (ref 36.0–46.0)
Hemoglobin: 14 g/dL (ref 12.0–15.0)
Hemoglobin: 14.7 g/dL (ref 12.0–15.0)
MCH: 29.1 pg (ref 26.0–34.0)
MCHC: 34 g/dL (ref 30.0–36.0)
RBC: 5.06 MIL/uL (ref 3.87–5.11)
WBC: 7 10*3/uL (ref 4.0–10.5)

## 2012-10-21 LAB — URIC ACID: Uric Acid, Serum: 7 mg/dL (ref 2.4–7.0)

## 2012-10-21 LAB — GLUCOSE, CAPILLARY: Glucose-Capillary: 94 mg/dL (ref 70–99)

## 2012-10-21 MED ORDER — FENTANYL 2.5 MCG/ML BUPIVACAINE 1/10 % EPIDURAL INFUSION (WH - ANES)
14.0000 mL/h | INTRAMUSCULAR | Status: DC
  Administered 2012-10-21: 14 mL/h via EPIDURAL
  Filled 2012-10-21: qty 125

## 2012-10-21 MED ORDER — TERBUTALINE SULFATE 1 MG/ML IJ SOLN: 0.2500 mg | Freq: Once | INTRAMUSCULAR | Status: AC | PRN

## 2012-10-21 MED ORDER — EPHEDRINE 5 MG/ML INJ: 10.0000 mg | INTRAVENOUS | Status: DC | PRN

## 2012-10-21 MED ORDER — SODIUM BICARBONATE 8.4 % IV SOLN
INTRAVENOUS | Status: DC | PRN
  Administered 2012-10-21: 5 mL via EPIDURAL

## 2012-10-21 MED ORDER — LACTATED RINGERS IV SOLN: 500.0000 mL | INTRAVENOUS | Status: DC | PRN

## 2012-10-21 MED ORDER — IBUPROFEN 600 MG PO TABS: 600.0000 mg | ORAL_TABLET | Freq: Four times a day (QID) | ORAL | Status: DC | PRN

## 2012-10-21 MED ORDER — OXYCODONE-ACETAMINOPHEN 5-325 MG PO TABS: 1.0000 | ORAL_TABLET | ORAL | Status: DC | PRN

## 2012-10-21 MED ORDER — MAGNESIUM SULFATE 40 G IN LACTATED RINGERS - SIMPLE
2.0000 g/h | INTRAVENOUS | Status: DC
  Filled 2012-10-21: qty 500

## 2012-10-21 MED ORDER — OXYTOCIN BOLUS FROM INFUSION: 500.0000 mL | INTRAVENOUS | Status: DC

## 2012-10-21 MED ORDER — ONDANSETRON HCL 4 MG/2ML IJ SOLN: 4.0000 mg | Freq: Four times a day (QID) | INTRAMUSCULAR | Status: DC | PRN

## 2012-10-21 MED ORDER — MAGNESIUM SULFATE BOLUS VIA INFUSION
6.0000 g | Freq: Once | INTRAVENOUS | Status: AC
  Administered 2012-10-21: 6 g via INTRAVENOUS
  Filled 2012-10-21: qty 500

## 2012-10-21 MED ORDER — EPHEDRINE 5 MG/ML INJ
10.0000 mg | INTRAVENOUS | Status: DC | PRN
  Filled 2012-10-21: qty 4

## 2012-10-21 MED ORDER — ACETAMINOPHEN 325 MG PO TABS: 650.0000 mg | ORAL_TABLET | ORAL | Status: DC | PRN

## 2012-10-21 MED ORDER — DIPHENHYDRAMINE HCL 50 MG/ML IJ SOLN: 12.5000 mg | INTRAMUSCULAR | Status: DC | PRN

## 2012-10-21 MED ORDER — OXYTOCIN 10 UNIT/ML IJ SOLN
10.0000 [IU] | Freq: Once | INTRAMUSCULAR | Status: DC
  Filled 2012-10-21: qty 1

## 2012-10-21 MED ORDER — LIDOCAINE HCL (PF) 1 % IJ SOLN
30.0000 mL | INTRAMUSCULAR | Status: DC | PRN
  Administered 2012-10-22: 30 mL via SUBCUTANEOUS
  Filled 2012-10-21 (×2): qty 30

## 2012-10-21 MED ORDER — OXYTOCIN 40 UNITS IN LACTATED RINGERS INFUSION - SIMPLE MED
1.0000 m[IU]/min | INTRAVENOUS | Status: DC
  Administered 2012-10-21: 1 m[IU]/min via INTRAVENOUS
  Administered 2012-10-21: 7 m[IU]/min via INTRAVENOUS
  Administered 2012-10-22: 16 m[IU]/min via INTRAVENOUS
  Filled 2012-10-21: qty 1000

## 2012-10-21 MED ORDER — NALBUPHINE SYRINGE 5 MG/0.5 ML
10.0000 mg | INJECTION | INTRAMUSCULAR | Status: DC | PRN
Start: 2012-10-21 — End: 2012-10-22
  Administered 2012-10-21: 10 mg via INTRAVENOUS
  Filled 2012-10-21 (×2): qty 1

## 2012-10-21 MED ORDER — PHENYLEPHRINE 40 MCG/ML (10ML) SYRINGE FOR IV PUSH (FOR BLOOD PRESSURE SUPPORT)
80.0000 ug | PREFILLED_SYRINGE | INTRAVENOUS | Status: DC | PRN
  Filled 2012-10-21: qty 5

## 2012-10-21 MED ORDER — OXYTOCIN 40 UNITS IN LACTATED RINGERS INFUSION - SIMPLE MED: 62.5000 mL/h | INTRAVENOUS | Status: DC

## 2012-10-21 MED ORDER — LACTATED RINGERS IV SOLN
INTRAVENOUS | Status: DC
  Administered 2012-10-21 (×5): via INTRAVENOUS

## 2012-10-21 MED ORDER — PHENYLEPHRINE 40 MCG/ML (10ML) SYRINGE FOR IV PUSH (FOR BLOOD PRESSURE SUPPORT): 80.0000 ug | PREFILLED_SYRINGE | INTRAVENOUS | Status: DC | PRN

## 2012-10-21 MED ORDER — LACTATED RINGERS IV SOLN
500.0000 mL | Freq: Once | INTRAVENOUS | Status: AC
  Administered 2012-10-21: 500 mL via INTRAVENOUS

## 2012-10-21 MED ORDER — CITRIC ACID-SODIUM CITRATE 334-500 MG/5ML PO SOLN: 30.0000 mL | ORAL | Status: DC | PRN

## 2012-10-21 NOTE — Anesthesia Procedure Notes (Signed)

## 2012-10-21 NOTE — Progress Notes (Signed)
10/21/12 1100  Clinical Encounter Type  Visited With Patient and family together (Husband)  Visit Type Follow-up;Spiritual support;Social support  Referral From Chaplain  Recommendations Spiritual Care will follow for support and encouragement.  Spiritual Encounters  Spiritual Needs Emotional;Prayer   Connie [name] was in good spirits and very spiritually grounded this morning as she begins to feel contractions.  We are acquainted from her stay on antenatal, and today I got to meet her husband, as well.  They are appreciative of pastoral support and are using prayer and gratitude to prepare their hearts and physical stamina for more intense labor and delivery.  I provided pastoral presence, witness to their story and celebration about reaching this milestone, and encouragement.  Will continue to follow for support.  Please page [phone] as family desires.  Thank you!  [address] [place], [place] [phone]

## 2012-10-21 NOTE — Anesthesia Preprocedure Evaluation (Signed)
Anesthesia Evaluation  Patient identified by MRN, date of birth, ID band Patient awake    Reviewed: Allergy & Precautions, H&P , Patient's Chart, lab work & pertinent test results  Airway Mallampati: II TM Distance: >3 FB Neck ROM: full    Dental  (+) Teeth Intact   Pulmonary  breath sounds clear to auscultation        Cardiovascular hypertension (On Mg+2), Rhythm:regular Rate:Normal     Neuro/Psych    GI/Hepatic   Endo/Other  diabetes, Gestational  Renal/GU      Musculoskeletal   Abdominal   Peds  Hematology   Anesthesia Other Findings       Reproductive/Obstetrics (+) Pregnancy                           Anesthesia Physical Anesthesia Plan  ASA: III  Anesthesia Plan: Epidural   Post-op Pain Management:    Induction:   Airway Management Planned:   Additional Equipment:   Intra-op Plan:   Post-operative Plan:   Informed Consent: I have reviewed the patients History and Physical, chart, labs and discussed the procedure including the risks, benefits and alternatives for the proposed anesthesia with the patient or authorized representative who has indicated his/her understanding and acceptance.   Dental Advisory Given  Plan Discussed with:   Anesthesia Plan Comments: (Labs checked- platelets confirmed with RN in room. Fetal heart tracing, per RN, reported to be stable enough for sitting procedure. Discussed epidural, and patient consents to the procedure:  included risk of possible headache,backache, failed block, allergic reaction, and nerve injury. This patient was asked if she had any questions or concerns before the procedure started. )        Anesthesia Quick Evaluation

## 2012-10-21 NOTE — Progress Notes (Signed)
Connie [name] is a 32 y.o. G2P0101 at [redacted]w[redacted]d by ultrasound admitted for induction of labor due to Gestational diabetes and Pre-eclamptic toxemia of pregnancy..  Subjective: Chief Complaint  Patient presents with  . Hypertension  notes rectal pressure Pitocin    Magnesium Sulfate Objective: BP 148/87  Pulse 69  Temp(Src) 97.5 F (36.4 C) (Oral)  Resp 20  Ht 5\' 10"  (1.778 m)  Wt 77.157 kg (170 lb 1.6 oz)  BMI 24.41 kg/m2  SpO2 100%   Total I/O In: 2203.4 [P.O.:1080; I.V.:1123.4] Out: 1775 [Urine:1775]  FHT:  FHR: 120 bpm, variability: minimal ,  accelerations:  Abscent,  decelerations:  Present early decel UC:   irregular, every 2-4 minutes SVE:   4 cm dilated, 70%effaced, -3 station. Intracervical balloon out AROM clear fluid. IUPC. ISE placed  Labs: Lab Results  Component Value Date   WBC 7.0 10/21/2012   HGB 14.0 10/21/2012   HCT 41.2 10/21/2012   MCV 86.2 10/21/2012   PLT 224 10/21/2012  uric acid 7.0 other PIH labs wnl  Assessment / Plan: Active phase Preeclampsia on Magnesium sulfate and labetalol Class A2GDM  Prev C/S P) exaggerated left sims position. Cont pitocin. Assess ctx strength,. chieck Mg level, cbc Anticipated MOD:  NSVD  Connie [name] A 10/21/2012, 5:05 PM

## 2012-10-21 NOTE — Progress Notes (Signed)
S: comfortable Epidural  O: BP VE 4/90/+1 station asynclytic  Tracing: baseline 120 (+) small accels med variability (+) early decel Couplets q 3- 3 1/2 mins  Mg level 5.1  IMP: Preeclampsia on Magnesium sulfate and labetalol Class A2  GDM Previous C/S desires AVBAC P) exaggerated right sims. Cont increase pitocin for regular pattern. Recheck magnesium level

## 2012-10-21 NOTE — Progress Notes (Signed)
Ur chart review completed.  

## 2012-10-21 NOTE — Progress Notes (Signed)
Transferred to L&D for induction  S> no complaint Pitocin O> BP 142/99 VE 1/60-70%/-3/-2 applied posterior soft sl funnell Extr: no edema Intracervical balloon placed Tracing; baseline 140 min variability No ctx  BS 79  IMP: Preeclampsia on labetalol and Magnesium sulfate Class A2 GDM Prev C/s for AVBAC IUP @ 37 weeks P) cont labetalol. BS q 2 hr. Pitocin. Magnesium  level q 6 hrs analgesic prn. Strict I & O

## 2012-10-22 ENCOUNTER — Encounter (HOSPITAL_COMMUNITY): Payer: Self-pay | Admitting: *Deleted

## 2012-10-22 DIAGNOSIS — O34219 Maternal care for unspecified type scar from previous cesarean delivery: Secondary | ICD-10-CM

## 2012-10-22 LAB — GLUCOSE, CAPILLARY
Glucose-Capillary: 116 mg/dL — ABNORMAL HIGH (ref 70–99)
Glucose-Capillary: 136 mg/dL — ABNORMAL HIGH (ref 70–99)

## 2012-10-22 LAB — CBC
MCHC: 33.7 g/dL (ref 30.0–36.0)
RDW: 13.7 % (ref 11.5–15.5)
WBC: 18 10*3/uL — ABNORMAL HIGH (ref 4.0–10.5)

## 2012-10-22 LAB — MAGNESIUM
Magnesium: 7.1 mg/dL (ref 1.5–2.5)
Magnesium: 3.9 mg/dL — ABNORMAL HIGH (ref 1.5–2.5)

## 2012-10-22 MED ORDER — DIPHENHYDRAMINE HCL 25 MG PO CAPS: 25.0000 mg | ORAL_CAPSULE | Freq: Four times a day (QID) | ORAL | Status: DC | PRN

## 2012-10-22 MED ORDER — LACTATED RINGERS IV SOLN
INTRAVENOUS | Status: DC
  Administered 2012-10-22 (×2): via INTRAVENOUS

## 2012-10-22 MED ORDER — BENZOCAINE-MENTHOL 20-0.5 % EX AERO
1.0000 "application " | INHALATION_SPRAY | CUTANEOUS | Status: DC | PRN
  Filled 2012-10-22: qty 56

## 2012-10-22 MED ORDER — LANOLIN HYDROUS EX OINT: TOPICAL_OINTMENT | CUTANEOUS | Status: DC | PRN

## 2012-10-22 MED ORDER — ONDANSETRON HCL 4 MG/2ML IJ SOLN: 4.0000 mg | INTRAMUSCULAR | Status: DC | PRN

## 2012-10-22 MED ORDER — DIBUCAINE 1 % RE OINT: 1.0000 "application " | TOPICAL_OINTMENT | RECTAL | Status: DC | PRN

## 2012-10-22 MED ORDER — OXYCODONE-ACETAMINOPHEN 5-325 MG PO TABS: 1.0000 | ORAL_TABLET | ORAL | Status: DC | PRN

## 2012-10-22 MED ORDER — SENNOSIDES-DOCUSATE SODIUM 8.6-50 MG PO TABS
2.0000 | ORAL_TABLET | Freq: Every day | ORAL | Status: DC
  Administered 2012-10-22 – 2012-10-23 (×2): 2 via ORAL

## 2012-10-22 MED ORDER — SIMETHICONE 80 MG PO CHEW: 80.0000 mg | CHEWABLE_TABLET | ORAL | Status: DC | PRN

## 2012-10-22 MED ORDER — WITCH HAZEL-GLYCERIN EX PADS: 1.0000 "application " | MEDICATED_PAD | CUTANEOUS | Status: DC | PRN

## 2012-10-22 MED ORDER — PRENATAL MULTIVITAMIN CH
1.0000 | ORAL_TABLET | Freq: Every day | ORAL | Status: DC
  Administered 2012-10-23 – 2012-10-24 (×2): 1 via ORAL
  Filled 2012-10-22 (×4): qty 1

## 2012-10-22 MED ORDER — IBUPROFEN 600 MG PO TABS
600.0000 mg | ORAL_TABLET | Freq: Four times a day (QID) | ORAL | Status: DC
  Administered 2012-10-22 – 2012-10-24 (×9): 600 mg via ORAL
  Filled 2012-10-22 (×10): qty 1

## 2012-10-22 MED ORDER — ZOLPIDEM TARTRATE 5 MG PO TABS: 5.0000 mg | ORAL_TABLET | Freq: Every evening | ORAL | Status: DC | PRN

## 2012-10-22 MED ORDER — ONDANSETRON HCL 4 MG PO TABS: 4.0000 mg | ORAL_TABLET | ORAL | Status: DC | PRN

## 2012-10-22 MED ORDER — MAGNESIUM SULFATE 40 G IN LACTATED RINGERS - SIMPLE
1.0000 g/h | INTRAVENOUS | Status: AC
  Administered 2012-10-22: 1 g/h via INTRAVENOUS
  Filled 2012-10-22 (×2): qty 500

## 2012-10-22 MED ORDER — FERROUS SULFATE 325 (65 FE) MG PO TABS
325.0000 mg | ORAL_TABLET | Freq: Two times a day (BID) | ORAL | Status: DC
  Administered 2012-10-22 – 2012-10-24 (×6): 325 mg via ORAL
  Filled 2012-10-22 (×6): qty 1

## 2012-10-22 NOTE — Progress Notes (Signed)
Unable to assess patient at present due to infant status/skin to skin for nursing. 2 nursery RN's at bedside.

## 2012-10-22 NOTE — Progress Notes (Signed)
Dr. Cherly Hensen returned call and reported Magnesium level of 7.1. Orders received to hold Magnesium for now, recheck in 2 hours and call her for new orders. Patient to be transferred to 3rd floor.

## 2012-10-22 NOTE — Progress Notes (Signed)
UR chart review completed.  

## 2012-10-22 NOTE — Progress Notes (Signed)
Transferred to room 374

## 2012-10-22 NOTE — Progress Notes (Signed)
Lab called with critical Magnesium level of 7.1. Paged Dr. Cherly Hensen to report. Candise Che, RN

## 2012-10-23 MED ORDER — SODIUM CHLORIDE 0.9 % IJ SOLN
3.0000 mL | Freq: Two times a day (BID) | INTRAMUSCULAR | Status: DC
  Administered 2012-10-23 (×2): 3 mL via INTRAVENOUS

## 2012-10-23 MED ORDER — SODIUM CHLORIDE 0.9 % IJ SOLN: 3.0000 mL | INTRAMUSCULAR | Status: DC | PRN

## 2012-10-23 MED ORDER — TETANUS-DIPHTH-ACELL PERTUSSIS 5-2.5-18.5 LF-MCG/0.5 IM SUSP
0.5000 mL | Freq: Once | INTRAMUSCULAR | Status: AC
  Administered 2012-10-23: 0.5 mL via INTRAMUSCULAR
  Filled 2012-10-23: qty 0.5

## 2012-10-23 MED ORDER — GLUCOSE-VITAMIN C 4-6 GM-MG PO CHEW
CHEWABLE_TABLET | ORAL | Status: AC
  Filled 2012-10-23: qty 1

## 2012-10-23 NOTE — Progress Notes (Signed)
Room #134 on [facility] assigned - pt to transfer at about 12:00 per [facility] request.

## 2012-10-23 NOTE — Progress Notes (Signed)
Pt transferred ambulatory to rm #[id number] - Elmarie Shiley, RN updated

## 2012-10-23 NOTE — Anesthesia Postprocedure Evaluation (Signed)
Anesthesia Post Note  Patient: Connie [name]  Procedure(s) Performed: * No procedures listed *  Anesthesia type: Epidural  Patient location: Mother/Baby  Post pain: Pain level controlled  Post assessment: Post-op Vital signs reviewed  Last Vitals:  Filed Vitals:   10/23/12 1010  BP: 136/93  Pulse: 93  Temp:   Resp: 16    Post vital signs: Reviewed  Level of consciousness:alert  Complications: No apparent anesthesia complications

## 2012-10-23 NOTE — Progress Notes (Signed)
Patient ID: Connie [name], female   DOB: [birth year], 32 y.o.   MRN: [id number] PPD # 1 S/P VBAC @ wks/ Severe PEC  Subjective: Pt reports feeling well/ Pain controlled with ibuprofen Denies HA, epigastric pain, SOB, CP. Tolerating po/ Voiding without problems/ No n/v Bleeding is moderate Newborn info:  Information for the patient's newborn:  [name], Connie Jentry [[id number]]  female Feeding: breast   Objective:  VS: Blood pressure 136/93, pulse 93, temperature 98.2 F (36.8 C), temperature source Oral, resp. rate 16.    Recent Labs  10/21/12 1727 10/22/12 0533  WBC 11.3* 18.0*  HGB 14.7 12.4  HCT 43.8 36.8  PLT 204 185    Blood type: --/--/A POS (02/23 1500) Rubella: Immune (08/12 0000)    Physical Exam:  General:  alert, cooperative and no distress CV: Regular rate and rhythm Resp: clear Abdomen: soft, nontender, normal bowel sounds Uterine Fundus: firm, below umbilicus, nontender Perineum: healing well with mild edema; labial lac with repair healing well. Lochia: moderate Ext: edema trace to +1 and Homans sign is negative, no sign of DVT   A/P: PPD # 1/ G2P1102/ S/P: VBAC at 34 weeks Hx PIH/PEC, now stabilizing.  Magnesium stopped at 0700, UOP excellent, BP's remain labile, no PEC symptoms. Hx GDM A2, resolved Will plan transfer to regular pp bed at 1100 Status improving    Demetrius Revel, MSN, [facility] 10/23/2012, 10:30 AM

## 2012-10-23 NOTE — Progress Notes (Signed)
M[facility] charge RN, Dorene Grebe notified of potential pt transfer after 11:00 - awaiting MD rounding/orders.

## 2012-10-24 MED ORDER — IBUPROFEN 600 MG PO TABS: 600.0000 mg | ORAL_TABLET | Freq: Four times a day (QID) | ORAL | Status: DC | PRN

## 2012-10-24 NOTE — Discharge Summary (Signed)
Physician Discharge Summary  Patient ID: Connie [name] MRN: [id number] DOB/AGE: 32-Sep-1982 32 y.o.  Admit date: 09/30/2012 Discharge date: 10/24/2012  Admission Diagnoses:  Discharge Diagnoses:  Principal Problem:   Postpartum care following vaginal delivery VBAC (10/22/12) Active Problems:   Pre-eclampsia in third trimester   GDM (gestational diabetes mellitus), class A1   VBAC (vaginal birth after Cesarean)   Discharged Condition: stable  Hospital Course: Patient has been hospitalized in 3rd trimester for Surveillance of pre Eclampsia and Surveillance of GDM Class A2 (Insulin) at [redacted] weeks gestation. Pt was placed on labetalol. Hx notable for previous delivery at 27 week via cesarean section due to severe preeclampsia. Patient desired attempted vaginal delivery. .Per MFM, induction of labor @ 37 week:  Pre eclampsia - Magnesium Sulfate Therapy and IOL with intracervical balloon and pitocin. Successful VBAC mode of delivery viable female infant( 4lb 15 oz) at 37wk 1d   Consults: MFM  Significant Diagnostic Studies: labs: GDM and managed on insulin, microbiology: GBS: negataive and radiology: Ultrasound: Anatomy Normal, Serial BPPs for Fetal Surveillance during hospitalization.  Treatments: IV hydration and analgesia: Ibuprofen  Discharge Exam: Blood pressure 130/91, pulse 86, temperature 98.1 F (36.7 C), temperature source Oral, resp. rate 18, height 5\' 10"  (1.778 m), weight 77.248 kg (170 lb 4.8 oz), SpO2 100.00%, unknown if currently breastfeeding.  Results for orders placed during the hospital encounter of 09/30/12 (from the past 72 hour(s))  GLUCOSE, CAPILLARY     Status: None   Collection Time    10/21/12  1:51 PM      Result Value Range   Glucose-Capillary 77  70 - 99 mg/dL  GLUCOSE, CAPILLARY     Status: None   Collection Time    10/21/12  3:37 PM      Result Value Range   Glucose-Capillary 97  70 - 99 mg/dL  CBC     Status: Abnormal   Collection Time    10/21/12   5:27 PM      Result Value Range   WBC 11.3 (*) 4.0 - 10.5 K/uL   RBC 5.06  3.87 - 5.11 MIL/uL   Hemoglobin 14.7  12.0 - 15.0 g/dL   HCT 95.6  21.3 - 08.6 %   MCV 86.6  78.0 - 100.0 fL   MCH 29.1  26.0 - 34.0 pg   MCHC 33.6  30.0 - 36.0 g/dL   RDW 57.8  46.9 - 62.9 %   Platelets 204  150 - 400 K/uL  MAGNESIUM     Status: Abnormal   Collection Time    10/21/12  5:27 PM      Result Value Range   Magnesium 5.1 (*) 1.5 - 2.5 mg/dL   Comment: RESULT CONFIRMED BY AUTOMATED DILUTION  GLUCOSE, CAPILLARY     Status: None   Collection Time    10/21/12  5:36 PM      Result Value Range   Glucose-Capillary 85  70 - 99 mg/dL  GLUCOSE, CAPILLARY     Status: None   Collection Time    10/21/12  7:43 PM      Result Value Range   Glucose-Capillary 94  70 - 99 mg/dL  GLUCOSE, CAPILLARY     Status: Abnormal   Collection Time    10/21/12 11:24 PM      Result Value Range   Glucose-Capillary 116 (*) 70 - 99 mg/dL  MAGNESIUM     Status: Abnormal   Collection Time    10/22/12  2:20 AM  Result Value Range   Magnesium 7.1 (*) 1.5 - 2.5 mg/dL   Comment: CRITICAL RESULT CALLED TO, READ BACK BY AND VERIFIED WITH:     PAYNE,G. AT 0255 ON Oct 22 2012 BY HOUEGNIFIO M.     REPEATED TO VERIFY     RESULT CONFIRMED BY AUTOMATED DILUTION  GLUCOSE, CAPILLARY     Status: Abnormal   Collection Time    10/22/12  2:21 AM      Result Value Range   Glucose-Capillary 136 (*) 70 - 99 mg/dL  MRSA PCR SCREENING     Status: None   Collection Time    10/22/12  5:05 AM      Result Value Range   MRSA by PCR NEGATIVE  NEGATIVE   Comment:            The GeneXpert MRSA Assay (FDA     approved for NASAL specimens     only), is one component of a     comprehensive MRSA colonization     surveillance program. It is not     intended to diagnose MRSA     infection nor to guide or     monitor treatment for     MRSA infections.  CBC     Status: Abnormal   Collection Time    10/22/12  5:33 AM      Result Value  Range   WBC 18.0 (*) 4.0 - 10.5 K/uL   RBC 4.30  3.87 - 5.11 MIL/uL   Hemoglobin 12.4  12.0 - 15.0 g/dL   HCT 16.1  09.6 - 04.5 %   MCV 85.6  78.0 - 100.0 fL   MCH 28.8  26.0 - 34.0 pg   MCHC 33.7  30.0 - 36.0 g/dL   RDW 40.9  81.1 - 91.4 %   Platelets 185  150 - 400 K/uL  GLUCOSE, RANDOM     Status: Abnormal   Collection Time    10/22/12  5:33 AM      Result Value Range   Glucose, Bld 215 (*) 70 - 99 mg/dL  MAGNESIUM     Status: Abnormal   Collection Time    10/22/12  5:33 AM      Result Value Range   Magnesium 3.9 (*) 1.5 - 2.5 mg/dL  RPR     Status: None   Collection Time    10/22/12  5:33 AM      Result Value Range   RPR NON REACTIVE  NON REACTIVE   Physical Examination: General appearance: alert, cooperative and no distress Affect: AAO x 3 Breasts: N/T Lungs: CTAB CV: RRR Abdomen: Soft, N/T B/S x 4 Fundus: -2/u firm GU: no problems voiding GI: Normal Lochia: min, Rubra Extremities: No edema/ swelling upper/ lower extremities   Disposition: Final discharge disposition not confirmed  Discharge Orders   Future Orders Complete By Expires     Activity as tolerated  As directed     Diet Carb Modified  As directed     Diet general  As directed     Discharge instructions  As directed     Comments:      Per [facility] Booklet        Medication List    STOP taking these medications       insulin aspart 100 UNIT/ML injection  Commonly known as:  novoLOG     insulin NPH 100 UNIT/ML injection  Commonly known as:  HUMULIN N,NOVOLIN N  TAKE these medications       acetaminophen 325 MG tablet  Commonly known as:  TYLENOL  Take 650 mg by mouth daily as needed. For headache     ibuprofen 600 MG tablet  Commonly known as:  ADVIL,MOTRIN  Take 1 tablet (600 mg total) by mouth every 6 (six) hours as needed for pain.     prenatal multivitamin Tabs  Take 1 tablet by mouth daily.           Follow-up Information   Follow up with [facility] Schedule an appointment as soon as possible for a visit in 6 weeks. (As needed)    Contact information:   [address] [place] [address] [phone]      Signed: Earl Gala 10/24/2012, 11:31 AM

## 2012-10-24 NOTE — Progress Notes (Signed)
Post Partum Day 2 s/p  TOLAC with Vacuum Assisted Delivery Viable Female Subjective: no complaints, up ad lib without syncope, voiding, tolerating PO,  Pain well controlled with po meds,  BF: On demand Mood stable, bonding well   Objective: Blood pressure 130/91, pulse 86, temperature 98.1 F (36.7 C), temperature source Oral, resp. rate 18, height 5\' 10"  (1.778 m), weight 77.248 kg (170 lb 4.8 oz), SpO2 100.00%, unknown if currently breastfeeding.  Physical Exam:  General: NAD  Lochia: appropriate Uterine Fundus: firm Perineum: healing well, slight bruising. DVT Evaluation: No evidence of DVT seen on physical exam. Negative Homan's sign. No cords or calf tenderness. No significant calf/ankle edema.   Recent Labs  10/21/12 1727 10/22/12 0533  HGB 14.7 12.4  HCT 43.8 36.8    Assessment/Plan: Discharge home F/u with Dr Cherly Hensen x 6 weeks    LOS: 24 days   Crissie Aloi 10/24/2012, 11:21 AM

## 2013-09-17 ENCOUNTER — Emergency Department (HOSPITAL_COMMUNITY)
Admission: EM | Admit: 2013-09-17 | Discharge: 2013-09-18 | Disposition: A | Discharge status: Home / Self Care | Attending: Emergency Medicine | Admitting: Emergency Medicine

## 2013-09-17 ENCOUNTER — Encounter (HOSPITAL_COMMUNITY): Payer: Self-pay | Admitting: Emergency Medicine

## 2013-09-17 DIAGNOSIS — Z3202 Encounter for pregnancy test, result negative: Secondary | ICD-10-CM | POA: Insufficient documentation

## 2013-09-17 DIAGNOSIS — Z79899 Other long term (current) drug therapy: Secondary | ICD-10-CM | POA: Insufficient documentation

## 2013-09-17 DIAGNOSIS — E860 Dehydration: Secondary | ICD-10-CM | POA: Insufficient documentation

## 2013-09-17 DIAGNOSIS — R5383 Other fatigue: Secondary | ICD-10-CM

## 2013-09-17 DIAGNOSIS — J40 Bronchitis, not specified as acute or chronic: Secondary | ICD-10-CM

## 2013-09-17 DIAGNOSIS — R5381 Other malaise: Secondary | ICD-10-CM | POA: Insufficient documentation

## 2013-09-17 DIAGNOSIS — R112 Nausea with vomiting, unspecified: Secondary | ICD-10-CM

## 2013-09-17 DIAGNOSIS — Z8679 Personal history of other diseases of the circulatory system: Secondary | ICD-10-CM | POA: Insufficient documentation

## 2013-09-17 DIAGNOSIS — E119 Type 2 diabetes mellitus without complications: Secondary | ICD-10-CM

## 2013-09-17 DIAGNOSIS — J111 Influenza due to unidentified influenza virus with other respiratory manifestations: Secondary | ICD-10-CM | POA: Insufficient documentation

## 2013-09-17 LAB — URINALYSIS, ROUTINE W REFLEX MICROSCOPIC
BILIRUBIN URINE: NEGATIVE
Glucose, UA: NEGATIVE mg/dL
HGB URINE DIPSTICK: NEGATIVE
KETONES UR: 15 mg/dL — AB
Leukocytes, UA: NEGATIVE
NITRITE: NEGATIVE
PH: 7 (ref 5.0–8.0)
Protein, ur: NEGATIVE mg/dL
SPECIFIC GRAVITY, URINE: 1.025 (ref 1.005–1.030)
Urobilinogen, UA: 1 mg/dL (ref 0.0–1.0)

## 2013-09-17 LAB — COMPREHENSIVE METABOLIC PANEL
ALBUMIN: 4 g/dL (ref 3.5–5.2)
ALK PHOS: 75 U/L (ref 39–117)
ALT: 12 U/L (ref 0–35)
AST: 15 U/L (ref 0–37)
BUN: 14 mg/dL (ref 6–23)
CALCIUM: 9.7 mg/dL (ref 8.4–10.5)
CO2: 28 mEq/L (ref 19–32)
CREATININE: 0.55 mg/dL (ref 0.50–1.10)
Chloride: 99 mEq/L (ref 96–112)
GFR calc non Af Amer: >90 mL/min (ref >90)
GLUCOSE: 187 mg/dL — AB (ref 70–99)
Potassium: 4.8 mEq/L (ref 3.7–5.3)
Sodium: 141 mEq/L (ref 137–147)
TOTAL PROTEIN: 8 g/dL (ref 6.0–8.3)
Total Bilirubin: 0.3 mg/dL (ref 0.3–1.2)

## 2013-09-17 LAB — CBC WITH DIFFERENTIAL/PLATELET
BASOS PCT: 1 % (ref 0–1)
Basophils Absolute: 0 10*3/uL (ref 0.0–0.1)
EOS ABS: 0.1 10*3/uL (ref 0.0–0.7)
EOS PCT: 2 % (ref 0–5)
HCT: 39.5 % (ref 36.0–46.0)
HEMOGLOBIN: 13.3 g/dL (ref 12.0–15.0)
LYMPHS ABS: 2.4 10*3/uL (ref 0.7–4.0)
Lymphocytes Relative: 41 % (ref 12–46)
MCH: 28.6 pg (ref 26.0–34.0)
MCHC: 33.7 g/dL (ref 30.0–36.0)
MCV: 84.9 fL (ref 78.0–100.0)
MONO ABS: 0.5 10*3/uL (ref 0.1–1.0)
MONOS PCT: 8 % (ref 3–12)
Neutro Abs: 2.9 10*3/uL (ref 1.7–7.7)
Neutrophils Relative %: 49 % (ref 43–77)
Platelets: 225 10*3/uL (ref 150–400)
RBC: 4.65 MIL/uL (ref 3.87–5.11)
RDW: 12.5 % (ref 11.5–15.5)
WBC: 5.8 10*3/uL (ref 4.0–10.5)

## 2013-09-17 LAB — POCT PREGNANCY, URINE: PREG TEST UR: NEGATIVE

## 2013-09-17 NOTE — ED Notes (Signed)
Pt c/o nausea, vomiting and diarrhea x's 2 days.  Pt st's she started taking Tamiflu 5 days ago.

## 2013-09-18 ENCOUNTER — Emergency Department (HOSPITAL_COMMUNITY)

## 2013-09-18 MED ORDER — ONDANSETRON HCL 4 MG/2ML IJ SOLN
4.0000 mg | Freq: Once | INTRAMUSCULAR | Status: AC
  Administered 2013-09-18: 4 mg via INTRAVENOUS
  Filled 2013-09-18: qty 2

## 2013-09-18 MED ORDER — SODIUM CHLORIDE 0.9 % IV BOLUS (SEPSIS)
1000.0000 mL | Freq: Once | INTRAVENOUS | Status: AC
  Administered 2013-09-18: 1000 mL via INTRAVENOUS

## 2013-09-18 MED ORDER — ONDANSETRON HCL 4 MG PO TABS: 4.0000 mg | ORAL_TABLET | Freq: Four times a day (QID) | ORAL | Status: DC

## 2013-09-18 NOTE — Discharge Instructions (Signed)

## 2013-09-18 NOTE — ED Provider Notes (Signed)
CSN: [id number]     Arrival date & time 09/17/13  1834 History   First MD Initiated Contact with Patient 09/17/13 2313     Chief Complaint  Patient presents with  . Emesis   (Consider location/radiation/quality/duration/timing/severity/associated sxs/prior Treatment) HPI This patient is a  33 year old woman with non-insulin-dependent diabetes. She presents with complaints of nausea and vomiting for the past couple of days. She's had an estimated average of 4 episodes per day of nonbloody and bilious emesis in 4 episodes of nonbloody diarrhea. She denies abdominal pain. She says she feels generally weak. She is also troubled by her parents cough but denies shortness of breath.  Patient denies any recent past medical history. However, I note that she is currently prescribed Tamiflu.   Past Medical History  Diagnosis Date  . Diabetes mellitus without complication   . Gestational diabetes   . Pregnancy induced hypertension   . Normal vaginal delivery 10/22/2012  . VBAC (vaginal birth after Cesarean) 10/22/2012   Past Surgical History  Procedure Laterality Date  . Cesarean section    . Cyst removal trunk     Family History  Problem Relation Age of Onset  . Other Neg Hx    History  Substance Use Topics  . Smoking status: Never Smoker   . Smokeless tobacco: Not on file  . Alcohol Use: No   OB History   Grav Para Term Preterm Abortions TAB SAB Ect Mult Living   2 2 1 1      2      Review of Systems Ten point review of symptoms performed and is negative with the exception of symptoms noted above.   Allergies  Review of patient's allergies indicates no known allergies.  Home Medications   Current Outpatient Rx  Name  Route  Sig  Dispense  Refill  . acetaminophen (TYLENOL) 325 MG tablet   Oral   Take 650 mg by mouth daily as needed. For headache         . ibuprofen (ADVIL,MOTRIN) 600 MG tablet   Oral   Take 1 tablet (600 mg total) by mouth every 6 (six) hours as needed  for pain.   30 tablet   2   . Prenatal Vit-Fe Fumarate-FA (PRENATAL MULTIVITAMIN) TABS   Oral   Take 1 tablet by mouth daily.          BP 138/83  Pulse 113  Temp(Src) 97.9 F (36.6 C) (Oral)  Resp 16  Ht 5\' 10"  (1.778 m)  Wt 157 lb (71.215 kg)  BMI 22.53 kg/m2  SpO2 97%  LMP 09/01/2013 Physical Exam Gen: well developed and well nourished appearing Head: NCAT Eyes: PERL, EOMI Nose: no epistaixis or rhinorrhea Mouth/throat: mucosa is moist and pink Neck: supple, no stridor Lungs: Respiratory rate 24 per minute, CTA B, no wheezing, rhonchi or rales CV: RRR, no murmur, extremities appear well perfused.  Abd: soft, notender, nondistended Back: no ttp, no cva ttp Skin: warm and dry Ext: normal to inspection, no dependent edema Neuro: CN ii-xii grossly intact, no focal deficits Psyche; normal affect,  calm and cooperative.   ED Course  Procedures (including critical care time) Labs Review Imaging Review Results for orders placed during the hospital encounter of 09/17/13 (from the past 24 hour(s))  URINALYSIS, ROUTINE W REFLEX MICROSCOPIC     Status: Abnormal   Collection Time    09/17/13  7:02 PM      Result Value Range   Color, Urine YELLOW  YELLOW  APPearance CLOUDY (*) CLEAR   Specific Gravity, Urine 1.025  1.005 - 1.030   pH 7.0  5.0 - 8.0   Glucose, UA NEGATIVE  NEGATIVE mg/dL   Hgb urine dipstick NEGATIVE  NEGATIVE   Bilirubin Urine NEGATIVE  NEGATIVE   Ketones, ur 15 (*) NEGATIVE mg/dL   Protein, ur NEGATIVE  NEGATIVE mg/dL   Urobilinogen, UA 1.0  0.0 - 1.0 mg/dL   Nitrite NEGATIVE  NEGATIVE   Leukocytes, UA NEGATIVE  NEGATIVE  POCT PREGNANCY, URINE     Status: None   Collection Time    09/17/13  7:18 PM      Result Value Range   Preg Test, Ur NEGATIVE  NEGATIVE  CBC WITH DIFFERENTIAL     Status: None   Collection Time    09/17/13 10:43 PM      Result Value Range   WBC 5.8  4.0 - 10.5 K/uL   RBC 4.65  3.87 - 5.11 MIL/uL   Hemoglobin 13.3  12.0 -  15.0 g/dL   HCT 13.0  86.5 - 78.4 %   MCV 84.9  78.0 - 100.0 fL   MCH 28.6  26.0 - 34.0 pg   MCHC 33.7  30.0 - 36.0 g/dL   RDW 69.6  29.5 - 28.4 %   Platelets 225  150 - 400 K/uL   Neutrophils Relative % 49  43 - 77 %   Neutro Abs 2.9  1.7 - 7.7 K/uL   Lymphocytes Relative 41  12 - 46 %   Lymphs Abs 2.4  0.7 - 4.0 K/uL   Monocytes Relative 8  3 - 12 %   Monocytes Absolute 0.5  0.1 - 1.0 K/uL   Eosinophils Relative 2  0 - 5 %   Eosinophils Absolute 0.1  0.0 - 0.7 K/uL   Basophils Relative 1  0 - 1 %   Basophils Absolute 0.0  0.0 - 0.1 K/uL  COMPREHENSIVE METABOLIC PANEL     Status: Abnormal   Collection Time    09/17/13 10:43 PM      Result Value Range   Sodium 141  137 - 147 mEq/L   Potassium 4.8  3.7 - 5.3 mEq/L   Chloride 99  96 - 112 mEq/L   CO2 28  19 - 32 mEq/L   Glucose, Bld 187 (*) 70 - 99 mg/dL   BUN 14  6 - 23 mg/dL   Creatinine, Ser 1.32  0.50 - 1.10 mg/dL   Calcium 9.7  8.4 - 44.0 mg/dL   Total Protein 8.0  6.0 - 8.3 g/dL   Albumin 4.0  3.5 - 5.2 g/dL   AST 15  0 - 37 U/L   ALT 12  0 - 35 U/L   Alkaline Phosphatase 75  39 - 117 U/L   Total Bilirubin 0.3  0.3 - 1.2 mg/dL   GFR calc non Af Amer >90  >90 mL/min   GFR calc Af Amer >90  >90 mL/min     MDM  Patient has had several days of flulike symptoms as currently being treated for influenza with Tamiflu. She is mildly cachectic and we will obtain a chest x-ray to rule out pneumonia. She is noted to have some ketones in her urine. We'll treat with IV fluids and antibiotics. CBC and multi-chem are reassuring. Anticipate this patient will be stable for discharge home with symptomatic management and plan for outpatient followup.    Brandt Loosen, MD 09/19/13 [phone]

## 2013-09-18 NOTE — ED Notes (Signed)
MD at bedside. 

## 2014-06-29 ENCOUNTER — Encounter (HOSPITAL_COMMUNITY): Payer: Self-pay | Admitting: Emergency Medicine

## 2014-10-27 IMAGING — CR DG CHEST 2V
2 series · 2 of 2 positions shown · non-contrast
Comparison: None.

CLINICAL DATA: Cough, nausea and vomiting.

EXAM:
CHEST  2 VIEW

[w chest pa]
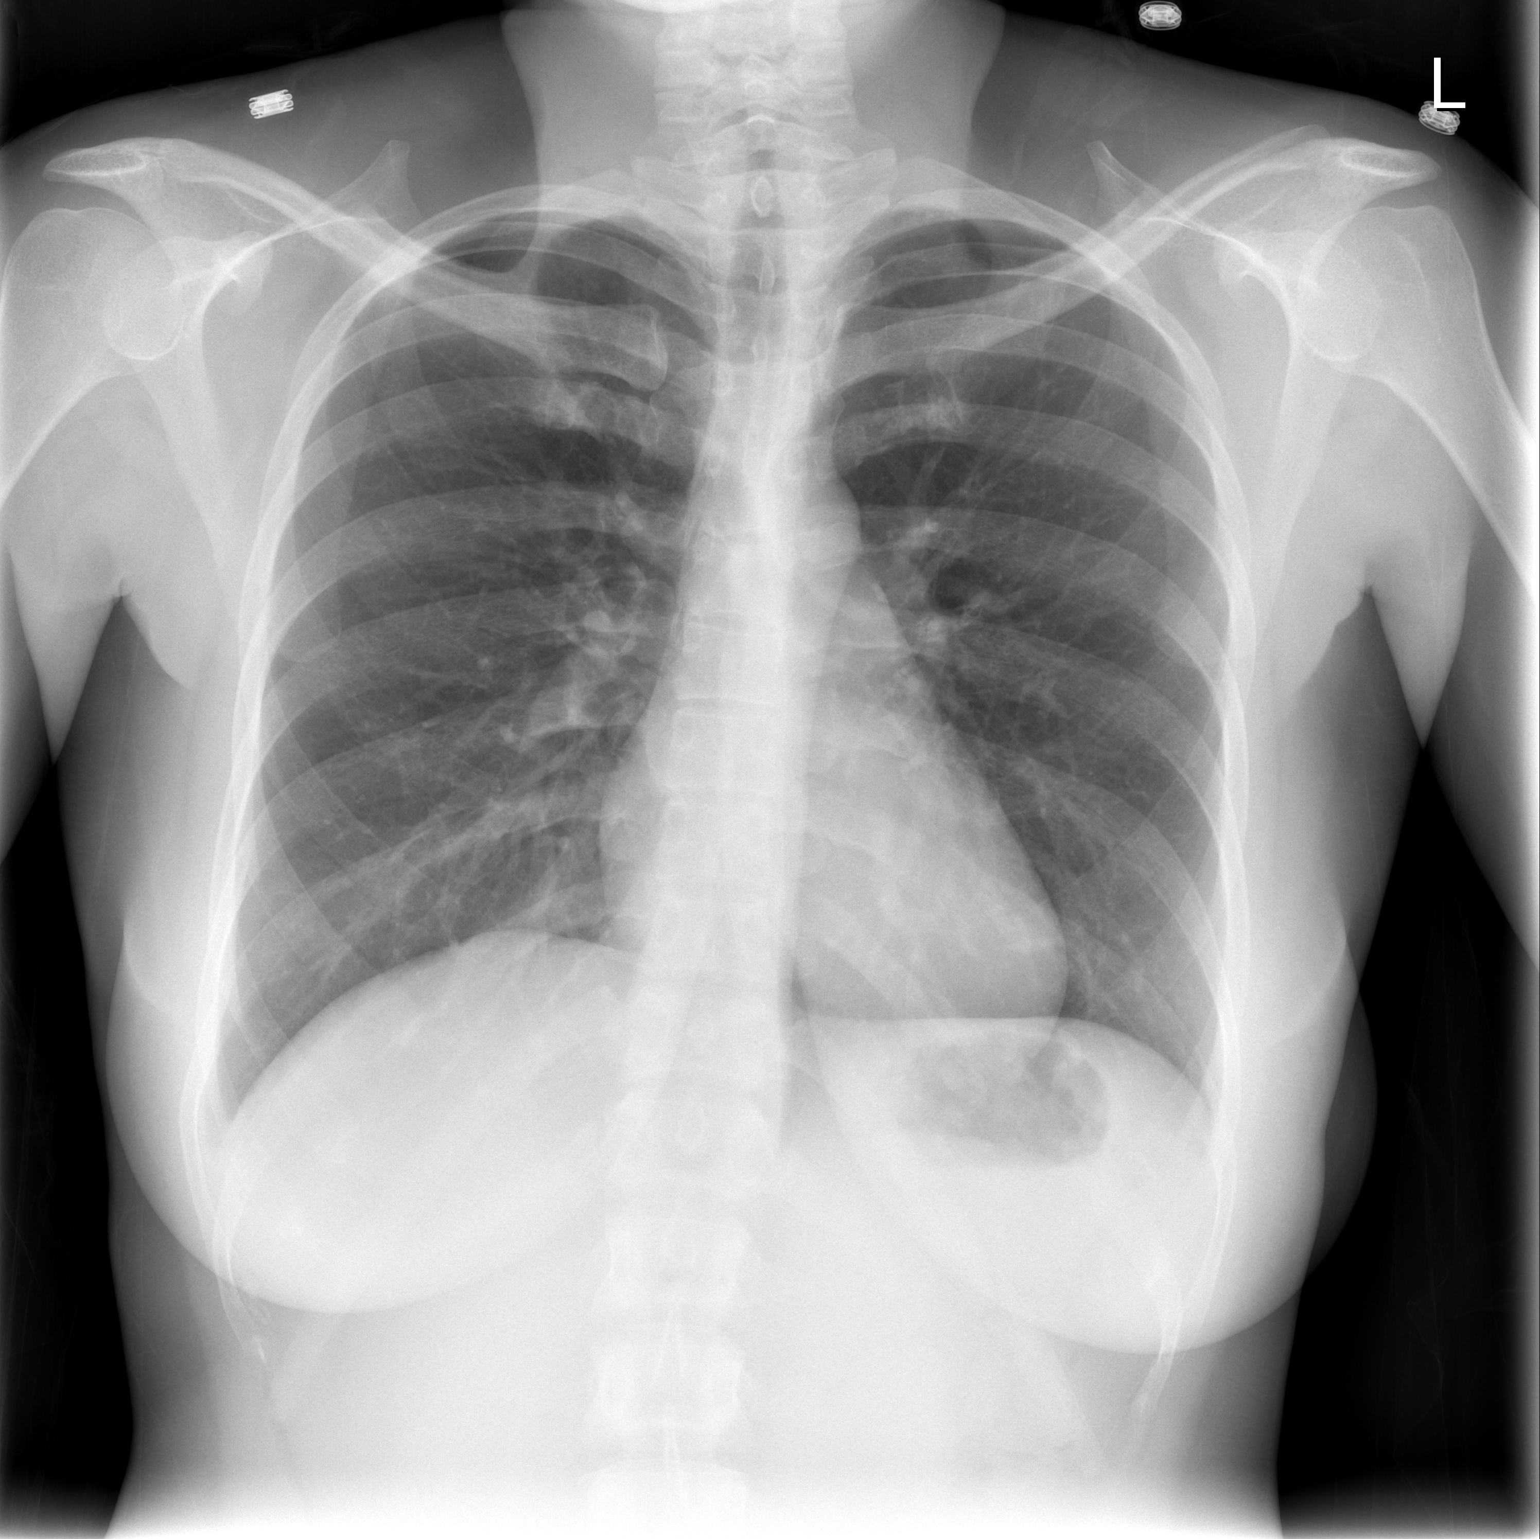

[w chest lat]
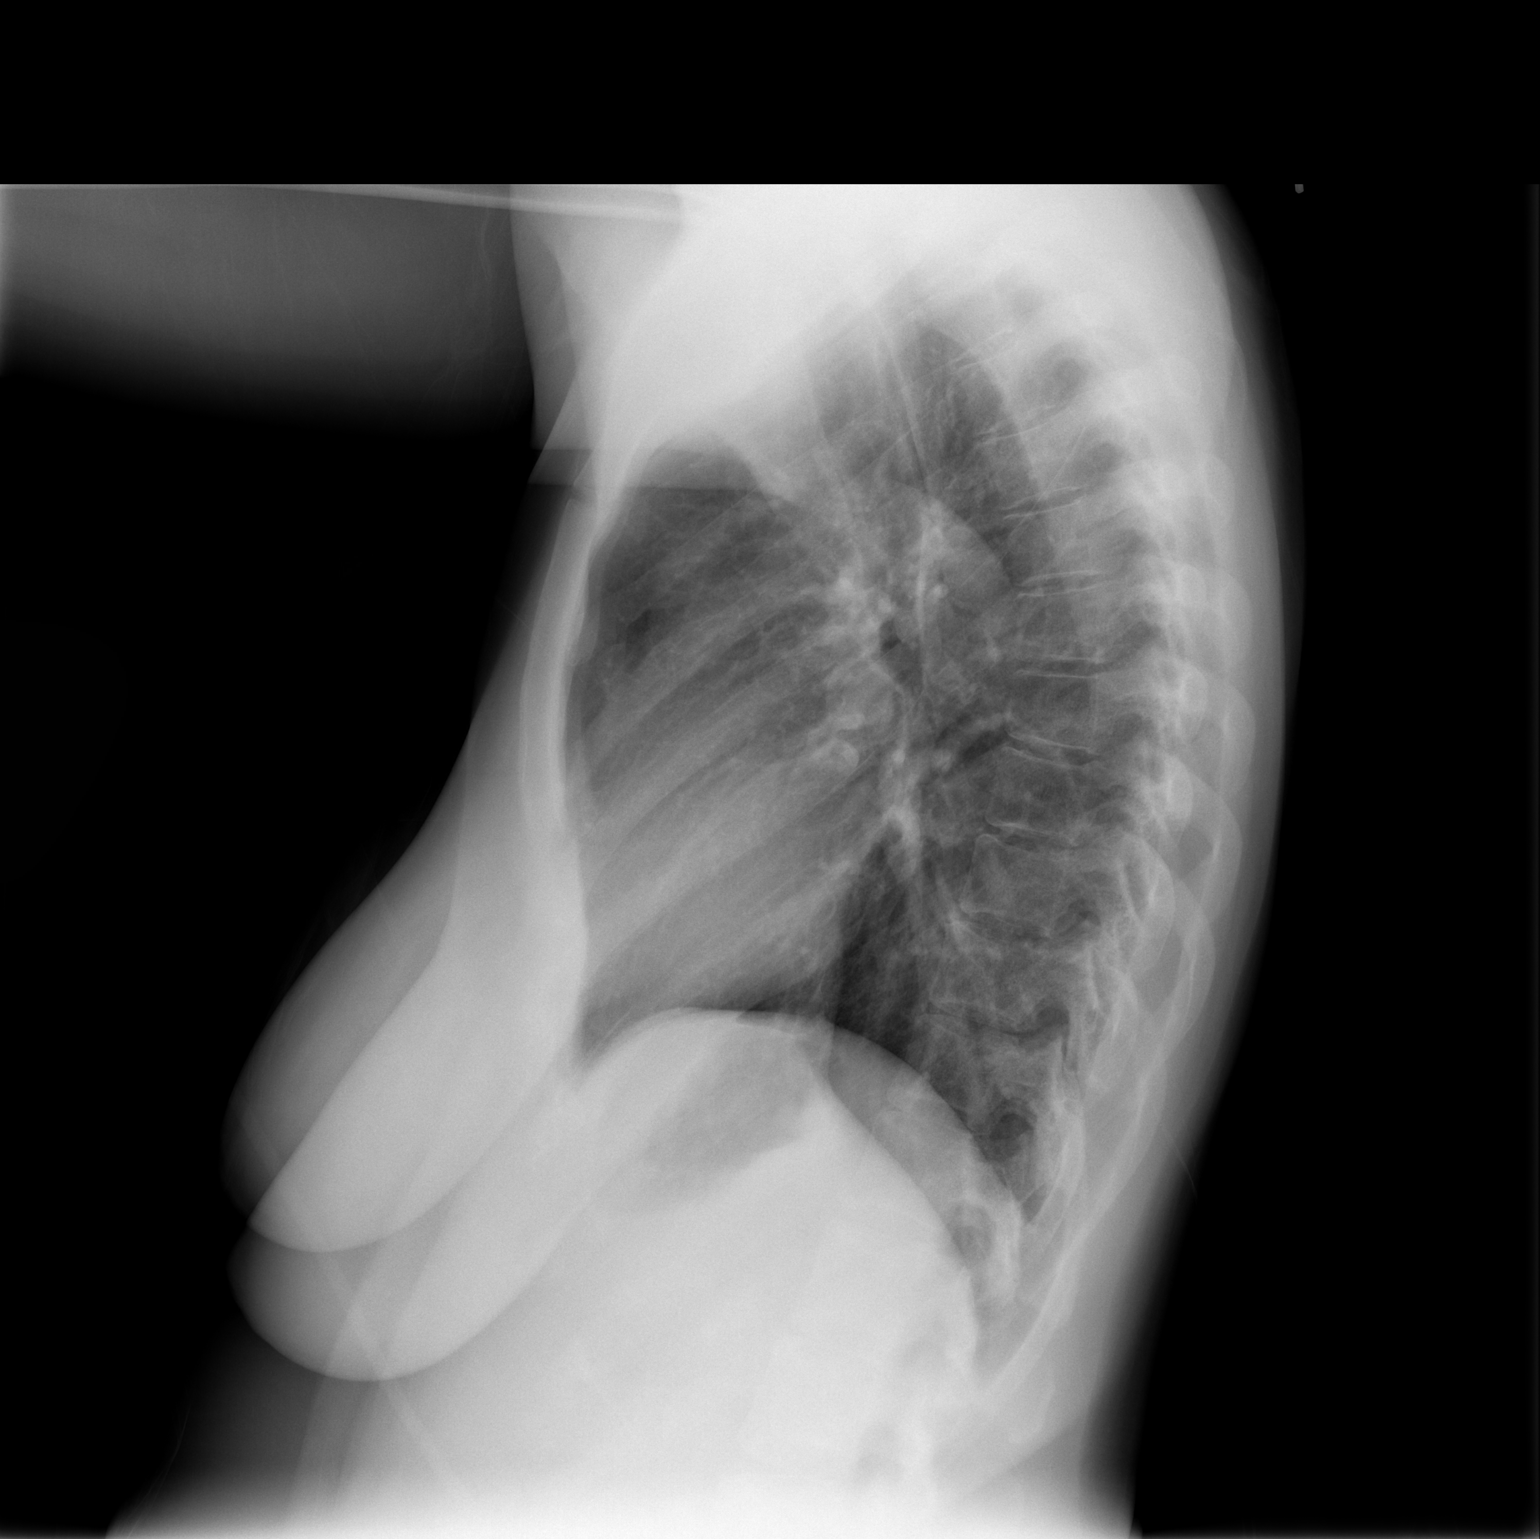

[2 of 2 positions shown; findings below may reference images not displayed]

FINDINGS: The lungs are well-aerated and clear. There is no evidence of focal
opacification, pleural effusion or pneumothorax.

The heart is normal in size; the mediastinal contour is within
normal limits. No acute osseous abnormalities are seen.
IMPRESSION: No acute cardiopulmonary process seen.

## 2014-11-08 ENCOUNTER — Encounter (HOSPITAL_COMMUNITY): Payer: Self-pay | Admitting: Emergency Medicine

## 2014-11-08 ENCOUNTER — Emergency Department (HOSPITAL_COMMUNITY)

## 2014-11-08 ENCOUNTER — Emergency Department (HOSPITAL_COMMUNITY)
Admission: EM | Admit: 2014-11-08 | Discharge: 2014-11-08 | Disposition: A | Discharge status: Home / Self Care | Attending: Emergency Medicine | Admitting: Emergency Medicine

## 2014-11-08 DIAGNOSIS — Z8632 Personal history of gestational diabetes: Secondary | ICD-10-CM | POA: Diagnosis not present

## 2014-11-08 DIAGNOSIS — E119 Type 2 diabetes mellitus without complications: Secondary | ICD-10-CM | POA: Insufficient documentation

## 2014-11-08 DIAGNOSIS — J069 Acute upper respiratory infection, unspecified: Secondary | ICD-10-CM | POA: Insufficient documentation

## 2014-11-08 DIAGNOSIS — R1110 Vomiting, unspecified: Secondary | ICD-10-CM | POA: Diagnosis not present

## 2014-11-08 DIAGNOSIS — R05 Cough: Secondary | ICD-10-CM | POA: Diagnosis present

## 2014-11-08 DIAGNOSIS — J988 Other specified respiratory disorders: Secondary | ICD-10-CM

## 2014-11-08 DIAGNOSIS — Z79899 Other long term (current) drug therapy: Secondary | ICD-10-CM | POA: Diagnosis not present

## 2014-11-08 DIAGNOSIS — B9789 Other viral agents as the cause of diseases classified elsewhere: Secondary | ICD-10-CM

## 2014-11-08 MED ORDER — IBUPROFEN 800 MG PO TABS: 800.0000 mg | ORAL_TABLET | Freq: Three times a day (TID) | ORAL | Status: DC

## 2014-11-08 MED ORDER — ACETAMINOPHEN 500 MG PO TABS: 500.0000 mg | ORAL_TABLET | Freq: Four times a day (QID) | ORAL | Status: DC | PRN

## 2014-11-08 MED ORDER — ALBUTEROL SULFATE HFA 108 (90 BASE) MCG/ACT IN AERS: 2.0000 | INHALATION_SPRAY | Freq: Four times a day (QID) | RESPIRATORY_TRACT | Status: DC | PRN

## 2014-11-08 MED ORDER — GUAIFENESIN 100 MG/5ML PO LIQD: 100.0000 mg | ORAL | Status: DC | PRN

## 2014-11-08 MED ORDER — IPRATROPIUM-ALBUTEROL 0.5-2.5 (3) MG/3ML IN SOLN
3.0000 mL | Freq: Once | RESPIRATORY_TRACT | Status: AC
  Administered 2014-11-08: 3 mL via RESPIRATORY_TRACT
  Filled 2014-11-08: qty 3

## 2014-11-08 NOTE — ED Notes (Signed)
Pt reports cough x 2 weeks, started with feeling like "allergies" so took OTC medications with no relief, cough worsening.  Coughed hard enough last pm to cause emesis x 1.  Pt reports a "burning and exploding" feeling in her forehead when coughing and discomfort in throat and chest when coughing.  Pt ambulatory to room.

## 2014-11-08 NOTE — ED Provider Notes (Signed)
CSN: [id number]     Arrival date & time 11/08/14  1011 History   First MD Initiated Contact with Patient 11/08/14 1109     Chief Complaint  Patient presents with  . Cough     (Consider location/radiation/quality/duration/timing/severity/associated sxs/prior Treatment) HPI Comments: Patient is a 34 year old female past history significant for DM presenting to the emergency department for evaluation of nonproductive cough 2 weeks. She states her symptoms started off like "allergies" she tried over-the-counter medicine without relief. States her cough is worsened, she is now having post tussive emesis 1. She states coughing is giving her a burning and "exploding" feeling in her forehead. Also endorses sore throat and post tussive chest tightness. She has tried Mucinex with no improvement. No modifying factors identified. Patient's are sick at home with cough and cold. Denies any fevers. Denies any recent travel outside the [place].    Past Medical History  Diagnosis Date  . Diabetes mellitus without complication   . Gestational diabetes   . Pregnancy induced hypertension   . Normal vaginal delivery 10/22/2012  . VBAC (vaginal birth after Cesarean) 10/22/2012   Past Surgical History  Procedure Laterality Date  . Cesarean section    . Cyst removal trunk     Family History  Problem Relation Age of Onset  . Other Neg Hx    History  Substance Use Topics  . Smoking status: Never Smoker   . Smokeless tobacco: Not on file  . Alcohol Use: No   OB History    Gravida Para Term Preterm AB TAB SAB Ectopic Multiple Living   Review of Systems  HENT: Positive for congestion and postnasal drip.   Respiratory: Positive for cough and chest tightness.   Gastrointestinal: Positive for vomiting (posttussive).  All other systems reviewed and are negative.     Allergies  Review of patient's allergies indicates no known allergies.  Home Medications   Prior to Admission  medications   Medication Sig Start Date End Date Taking? Authorizing Provider  acetaminophen (TYLENOL) 325 MG tablet Take 650 mg by mouth daily as needed for headache. For headache    Historical Provider, MD  acetaminophen (TYLENOL) 500 MG tablet Take 1 tablet (500 mg total) by mouth every 6 (six) hours as needed. 11/08/14   Jonus Coble, PA-C  albuterol (PROVENTIL HFA;VENTOLIN HFA) 108 (90 BASE) MCG/ACT inhaler Inhale 2 puffs into the lungs every 6 (six) hours as needed for wheezing or shortness of breath. 11/08/14   Jerick Khachatryan, PA-C  guaiFENesin (ROBITUSSIN) 100 MG/5ML liquid Take 5-10 mLs (100-200 mg total) by mouth every 4 (four) hours as needed for cough. 11/08/14   Manjot Beumer, PA-C  ibuprofen (ADVIL,MOTRIN) 200 MG tablet Take 200 mg by mouth every 6 (six) hours as needed for moderate pain.    Historical Provider, MD  ibuprofen (ADVIL,MOTRIN) 800 MG tablet Take 1 tablet (800 mg total) by mouth 3 (three) times daily. 11/08/14   Mayari Matus, PA-C  metFORMIN (GLUMETZA) 500 MG (MOD) 24 hr tablet Take 500 mg by mouth 2 (two) times daily.    Historical Provider, MD  naproxen sodium (ANAPROX) 220 MG tablet Take 440 mg by mouth daily as needed (pain).    Historical Provider, MD  ondansetron (ZOFRAN) 4 MG tablet Take 1 tablet (4 mg total) by mouth every 6 (six) hours. 09/18/13   Brandt Loosen, MD  oseltamivir (TAMIFLU) 75 MG capsule Take 75 mg by mouth  2 (two) times daily.    Historical Provider, MD   BP 119/84 mmHg  Pulse 97  Temp(Src) 98.4 F (36.9 C) (Oral)  Resp 17  Ht 5\' 10"  (1.778 m)  SpO2 98%  LMP 11/04/2014 Physical Exam  Constitutional: She is oriented to person, place, and time. She appears well-developed and well-nourished. No distress.  HENT:  Head: Normocephalic and atraumatic.  Right Ear: External ear normal.  Left Ear: External ear normal.  Nose: Nose normal.  Mouth/Throat: Oropharynx is clear and moist.  Eyes: Conjunctivae are normal.  Neck:  Normal range of motion. Neck supple.  Cardiovascular: Normal rate, regular rhythm and normal heart sounds.   Pulmonary/Chest: Effort normal. She has wheezes (lower lung fields).  Cough appreciated on examination.   Abdominal: Soft. There is no tenderness.  Musculoskeletal: Normal range of motion. She exhibits no edema.  Lymphadenopathy:    She has no cervical adenopathy.  Neurological: She is alert and oriented to person, place, and time.  Skin: Skin is warm and dry. She is not diaphoretic.  Psychiatric: She has a normal mood and affect.  Nursing note and vitals reviewed.   ED Course  Procedures (including critical care time) Medications  ipratropium-albuterol (DUONEB) 0.5-2.5 (3) MG/3ML nebulizer solution 3 mL (3 mLs Nebulization Given 11/08/14 1208)     Labs Review Labs Reviewed - No data to display  Imaging Review Dg Chest 2 View  11/08/2014   CLINICAL DATA:  Two week history of midsternal chest pain, nonproductive cough and headache. Current history of diabetes.  EXAM: CHEST  2 VIEW  COMPARISON:  09/18/2013.  FINDINGS: Cardiomediastinal silhouette unremarkable, unchanged. Lungs clear. Bronchovascular markings normal. Pulmonary vascularity normal. No visible pleural effusions. No pneumothorax. Mild pectus excavatum sternal deformity. Visualized bony thorax otherwise intact. No significant interval change.  IMPRESSION: No acute or significant abnormality.  Stable examination.   Electronically Signed   By: Hulan Saashomas  Lawrence M.D.   On: 11/08/2014 12:07     EKG Interpretation None      12:38 PM On re-evaluation wheezing resulted. Lungs CTA.   MDM   Final diagnoses:  Viral respiratory illness    Filed Vitals:   11/08/14 1251  BP: 119/84  Pulse: 97  Temp:   Resp:    Afebrile, NAD, non-toxic appearing, AAOx4.  I have reviewed nursing notes, vital signs, and all appropriate lab and imaging results for this patient.  Pt CXR negative for acute infiltrate. Patients symptoms  are consistent with URI, likely viral etiology. Discussed that antibiotics are not indicated for viral infections. Pt will be discharged with symptomatic treatment.  Verbalizes understanding and is agreeable with plan. Pt is hemodynamically stable & in NAD prior to dc.Patient is stable at time of discharge     Francee PiccoloJennifer Garrit Marrow, PA-C 11/08/14 1642  Benjiman CoreNathan Pickering, MD 11/11/14 [phone]

## 2014-11-08 NOTE — ED Notes (Signed)
p-t.l stated, I've had a cough for a week now.

## 2014-11-08 NOTE — Discharge Instructions (Signed)
Please follow up with your primary care physician in 1-2 days. If you do not have one please call the [facility] number listed above. Please alternate between Motrin and Tylenol every three hours for fevers and pain. Please use your inhaler 2 puffs every four to six hours as need for cough or wheezing. Please read all discharge instructions and return precautions.   Upper Respiratory Infection, Adult An upper respiratory infection (URI) is also sometimes known as the common cold. The upper respiratory tract includes the nose, sinuses, throat, trachea, and bronchi. Bronchi are the airways leading to the lungs. Most people improve within 1 week, but symptoms can last up to 2 weeks. A residual cough may last even longer.  CAUSES Many different viruses can infect the tissues lining the upper respiratory tract. The tissues become irritated and inflamed and often become very moist. Mucus production is also common. A cold is contagious. You can easily spread the virus to others by oral contact. This includes kissing, sharing a glass, coughing, or sneezing. Touching your mouth or nose and then touching a surface, which is then touched by another person, can also spread the virus. SYMPTOMS  Symptoms typically develop 1 to 3 days after you come in contact with a cold virus. Symptoms vary from person to person. They may include:  Runny nose.  Sneezing.  Nasal congestion.  Sinus irritation.  Sore throat.  Loss of voice (laryngitis).  Cough.  Fatigue.  Muscle aches.  Loss of appetite.  Headache.  Low-grade fever. DIAGNOSIS  You might diagnose your own cold based on familiar symptoms, since most people get a cold 2 to 3 times a year. Your caregiver can confirm this based on your exam. Most importantly, your caregiver can check that your symptoms are not due to another disease such as strep throat, sinusitis, pneumonia, asthma, or epiglottitis. Blood tests, throat tests, and  X-rays are not necessary to diagnose a common cold, but they may sometimes be helpful in excluding other more serious diseases. Your caregiver will decide if any further tests are required. RISKS AND COMPLICATIONS  You may be at risk for a more severe case of the common cold if you smoke cigarettes, have chronic heart disease (such as heart failure) or lung disease (such as asthma), or if you have a weakened immune system. The very young and very old are also at risk for more serious infections. Bacterial sinusitis, middle ear infections, and bacterial pneumonia can complicate the common cold. The common cold can worsen asthma and chronic obstructive pulmonary disease (COPD). Sometimes, these complications can require emergency medical care and may be life-threatening. PREVENTION  The best way to protect against getting a cold is to practice good hygiene. Avoid oral or hand contact with people with cold symptoms. Wash your hands often if contact occurs. There is no clear evidence that vitamin C, vitamin E, echinacea, or exercise reduces the chance of developing a cold. However, it is always recommended to get plenty of rest and practice good nutrition. TREATMENT  Treatment is directed at relieving symptoms. There is no cure. Antibiotics are not effective, because the infection is caused by a virus, not by bacteria. Treatment may include:  Increased fluid intake. Sports drinks offer valuable electrolytes, sugars, and fluids.  Breathing heated mist or steam (vaporizer or shower).  Eating chicken soup or other clear broths, and maintaining good nutrition.  Getting plenty of rest.  Using gargles or lozenges for comfort.  Controlling fevers with ibuprofen  or acetaminophen as directed by your caregiver.  Increasing usage of your inhaler if you have asthma. Zinc gel and zinc lozenges, taken in the first 24 hours of the common cold, can shorten the duration and lessen the severity of symptoms. Pain  medicines may help with fever, muscle aches, and throat pain. A variety of non-prescription medicines are available to treat congestion and runny nose. Your caregiver can make recommendations and may suggest nasal or lung inhalers for other symptoms.  HOME CARE INSTRUCTIONS   Only take over-the-counter or prescription medicines for pain, discomfort, or fever as directed by your caregiver.  Use a warm mist humidifier or inhale steam from a shower to increase air moisture. This may keep secretions moist and make it easier to breathe.  Drink enough water and fluids to keep your urine clear or pale yellow.  Rest as needed.  Return to work when your temperature has returned to normal or as your caregiver advises. You may need to stay home longer to avoid infecting others. You can also use a face mask and careful hand washing to prevent spread of the virus. SEEK MEDICAL CARE IF:   After the first few days, you feel you are getting worse rather than better.  You need your caregiver's advice about medicines to control symptoms.  You develop chills, worsening shortness of breath, or brown or red sputum. These may be signs of pneumonia.  You develop yellow or brown nasal discharge or pain in the face, especially when you bend forward. These may be signs of sinusitis.  You develop a fever, swollen neck glands, pain with swallowing, or white areas in the back of your throat. These may be signs of strep throat. SEEK IMMEDIATE MEDICAL CARE IF:   You have a fever.  You develop severe or persistent headache, ear pain, sinus pain, or chest pain.  You develop wheezing, a prolonged cough, cough up blood, or have a change in your usual mucus (if you have chronic lung disease).  You develop sore muscles or a stiff neck. Document Released: 02/07/2001 Document Revised: 11/06/2011 Document Reviewed: 11/19/2013 [facility] Patient Information 2015 [place], [place]. This information is not intended to replace  advice given to you by your health care provider. Make sure you discuss any questions you have with your health care provider.

## 2015-05-17 ENCOUNTER — Other Ambulatory Visit: Payer: Self-pay | Admitting: Infectious Disease

## 2015-05-17 ENCOUNTER — Ambulatory Visit
Admission: RE | Admit: 2015-05-17 | Discharge: 2015-05-17 | Disposition: A | Discharge status: Home / Self Care | Payer: No Typology Code available for payment source | Source: Ambulatory Visit | Attending: Infectious Disease | Admitting: Infectious Disease

## 2015-05-17 DIAGNOSIS — R7612 Nonspecific reaction to cell mediated immunity measurement of gamma interferon antigen response without active tuberculosis: Secondary | ICD-10-CM

## 2016-03-22 ENCOUNTER — Other Ambulatory Visit: Payer: Self-pay | Admitting: Obstetrics and Gynecology

## 2016-03-30 NOTE — Patient Instructions (Addendum)
Your procedure is scheduled on:  Friday, April 07, 2016  Enter through the Main Entrance of [facility] at:  6:00 AM  Pick up the phone at the desk and dial [phone].  Call this number if you have problems the morning of surgery: [phone].  Remember: Do NOT eat food or drink after:  Midnight Thursday  Take these medicines the morning of surgery with a SIP OF WATER:  None  Do NOT wear jewelry (body piercing), metal hair clips/bobby pins, make-up, or nail polish. Do NOT wear lotions, powders, or perfumes.  You may wear deodorant. Do NOT shave for 48 hours prior to surgery. Do NOT bring valuables to the hospital. Contacts, dentures, or bridgework may not be worn into surgery.  Have a responsible adult drive you home and stay with you for 24 hours after your procedure

## 2016-03-31 ENCOUNTER — Encounter (HOSPITAL_COMMUNITY)
Admission: RE | Admit: 2016-03-31 | Discharge: 2016-03-31 | Disposition: A | Discharge status: Home / Self Care | Source: Ambulatory Visit | Attending: Obstetrics and Gynecology | Admitting: Obstetrics and Gynecology

## 2016-03-31 ENCOUNTER — Encounter (HOSPITAL_COMMUNITY): Payer: Self-pay

## 2016-03-31 DIAGNOSIS — Z01812 Encounter for preprocedural laboratory examination: Secondary | ICD-10-CM | POA: Insufficient documentation

## 2016-03-31 HISTORY — DX: Insomnia, unspecified: G47.00

## 2016-03-31 HISTORY — DX: Mastodynia: N64.4

## 2016-03-31 LAB — BASIC METABOLIC PANEL
Anion gap: 8 (ref 5–15)
BUN: 10 mg/dL (ref 6–20)
CALCIUM: 9.1 mg/dL (ref 8.9–10.3)
CO2: 23 mmol/L (ref 22–32)
CREATININE: 0.6 mg/dL (ref 0.44–1.00)
Chloride: 100 mmol/L — ABNORMAL LOW (ref 101–111)
GFR calc Af Amer: >60 mL/min (ref >60)
GFR calc non Af Amer: >60 mL/min (ref >60)
GLUCOSE: 99 mg/dL (ref 65–99)
POTASSIUM: 3.8 mmol/L (ref 3.5–5.1)
SODIUM: 131 mmol/L — AB (ref 135–145)

## 2016-03-31 LAB — CBC
HCT: 34.8 % — ABNORMAL LOW (ref 36.0–46.0)
Hemoglobin: 11.7 g/dL — ABNORMAL LOW (ref 12.0–15.0)
MCH: 27.3 pg (ref 26.0–34.0)
MCHC: 33.6 g/dL (ref 30.0–36.0)
MCV: 81.3 fL (ref 78.0–100.0)
PLATELETS: 228 10*3/uL (ref 150–400)
RBC: 4.28 MIL/uL (ref 3.87–5.11)
RDW: 13.1 % (ref 11.5–15.5)
WBC: 7.9 10*3/uL (ref 4.0–10.5)

## 2016-04-07 ENCOUNTER — Encounter (HOSPITAL_COMMUNITY): Payer: Self-pay

## 2016-04-07 ENCOUNTER — Ambulatory Visit (HOSPITAL_COMMUNITY)
Admission: RE | Admit: 2016-04-07 | Discharge: 2016-04-07 | Disposition: A | Discharge status: Home / Self Care | Source: Ambulatory Visit | Attending: Obstetrics and Gynecology | Admitting: Obstetrics and Gynecology

## 2016-04-07 ENCOUNTER — Ambulatory Visit (HOSPITAL_COMMUNITY): Admitting: Anesthesiology

## 2016-04-07 ENCOUNTER — Encounter (HOSPITAL_COMMUNITY)
Admission: RE | Disposition: A | Discharge status: Home / Self Care | Payer: Self-pay | Source: Ambulatory Visit | Attending: Obstetrics and Gynecology

## 2016-04-07 DIAGNOSIS — O139 Gestational [pregnancy-induced] hypertension without significant proteinuria, unspecified trimester: Secondary | ICD-10-CM | POA: Diagnosis present

## 2016-04-07 DIAGNOSIS — Z3009 Encounter for other general counseling and advice on contraception: Secondary | ICD-10-CM

## 2016-04-07 DIAGNOSIS — E119 Type 2 diabetes mellitus without complications: Secondary | ICD-10-CM | POA: Diagnosis not present

## 2016-04-07 DIAGNOSIS — Z302 Encounter for sterilization: Secondary | ICD-10-CM | POA: Insufficient documentation

## 2016-04-07 HISTORY — PX: LAPAROSCOPIC TUBAL LIGATION: SHX1937

## 2016-04-07 LAB — GLUCOSE, CAPILLARY
GLUCOSE-CAPILLARY: 125 mg/dL — AB (ref 65–99)
Glucose-Capillary: 114 mg/dL — ABNORMAL HIGH (ref 65–99)

## 2016-04-07 LAB — PREGNANCY, URINE: PREG TEST UR: NEGATIVE

## 2016-04-07 SURGERY — LIGATION, FALLOPIAN TUBE, LAPAROSCOPIC
Anesthesia: General | Laterality: Bilateral

## 2016-04-07 MED ORDER — FENTANYL CITRATE (PF) 250 MCG/5ML IJ SOLN
INTRAMUSCULAR | Status: AC
Start: 2016-04-07 — End: 2016-04-07
  Filled 2016-04-07: qty 5

## 2016-04-07 MED ORDER — ONDANSETRON HCL 4 MG/2ML IJ SOLN
INTRAMUSCULAR | Status: DC | PRN
  Administered 2016-04-07: 4 mg via INTRAVENOUS

## 2016-04-07 MED ORDER — BUPIVACAINE HCL (PF) 0.25 % IJ SOLN
INTRAMUSCULAR | Status: DC | PRN
  Administered 2016-04-07: 7 mL

## 2016-04-07 MED ORDER — SODIUM CHLORIDE 0.9 % IJ SOLN
INTRAMUSCULAR | Status: DC | PRN
  Administered 2016-04-07: 10 mL via INTRAVENOUS

## 2016-04-07 MED ORDER — LACTATED RINGERS IV SOLN: INTRAVENOUS | Status: DC

## 2016-04-07 MED ORDER — LACTATED RINGERS IV SOLN
INTRAVENOUS | Status: DC
  Administered 2016-04-07: 125 mL/h via INTRAVENOUS
  Administered 2016-04-07: 08:00:00 via INTRAVENOUS

## 2016-04-07 MED ORDER — LIDOCAINE HCL (CARDIAC) 20 MG/ML IV SOLN
INTRAVENOUS | Status: AC
  Filled 2016-04-07: qty 5

## 2016-04-07 MED ORDER — PROPOFOL 10 MG/ML IV BOLUS
INTRAVENOUS | Status: AC
  Filled 2016-04-07: qty 20

## 2016-04-07 MED ORDER — FENTANYL CITRATE (PF) 100 MCG/2ML IJ SOLN
INTRAMUSCULAR | Status: DC | PRN
  Administered 2016-04-07: 100 ug via INTRAVENOUS

## 2016-04-07 MED ORDER — DEXAMETHASONE SODIUM PHOSPHATE 4 MG/ML IJ SOLN
INTRAMUSCULAR | Status: AC
  Filled 2016-04-07: qty 1

## 2016-04-07 MED ORDER — SODIUM CHLORIDE 0.9 % IJ SOLN
INTRAMUSCULAR | Status: AC
  Filled 2016-04-07: qty 10

## 2016-04-07 MED ORDER — PROPOFOL 10 MG/ML IV BOLUS
INTRAVENOUS | Status: DC | PRN
Start: 2016-04-07 — End: 2016-04-07
  Administered 2016-04-07: 200 mg via INTRAVENOUS

## 2016-04-07 MED ORDER — MIDAZOLAM HCL 2 MG/2ML IJ SOLN
INTRAMUSCULAR | Status: DC | PRN
  Administered 2016-04-07: 2 mg via INTRAVENOUS

## 2016-04-07 MED ORDER — BUPIVACAINE HCL (PF) 0.25 % IJ SOLN
INTRAMUSCULAR | Status: AC
  Filled 2016-04-07: qty 30

## 2016-04-07 MED ORDER — SUGAMMADEX SODIUM 200 MG/2ML IV SOLN
INTRAVENOUS | Status: DC | PRN
  Administered 2016-04-07: 128 mg via INTRAVENOUS

## 2016-04-07 MED ORDER — LIDOCAINE HCL 1 % IJ SOLN
INTRAMUSCULAR | Status: AC
  Filled 2016-04-07: qty 20

## 2016-04-07 MED ORDER — SCOPOLAMINE 1 MG/3DAYS TD PT72
MEDICATED_PATCH | TRANSDERMAL | Status: AC
  Administered 2016-04-07: 1.5 mg via TRANSDERMAL
  Filled 2016-04-07: qty 1

## 2016-04-07 MED ORDER — LIDOCAINE HCL (CARDIAC) 20 MG/ML IV SOLN
INTRAVENOUS | Status: DC | PRN
  Administered 2016-04-07: 100 mg via INTRAVENOUS

## 2016-04-07 MED ORDER — HYDROMORPHONE HCL 1 MG/ML IJ SOLN: 0.2500 mg | INTRAMUSCULAR | Status: DC | PRN

## 2016-04-07 MED ORDER — MIDAZOLAM HCL 2 MG/2ML IJ SOLN
INTRAMUSCULAR | Status: AC
  Filled 2016-04-07: qty 2

## 2016-04-07 MED ORDER — SCOPOLAMINE 1 MG/3DAYS TD PT72
1.0000 | MEDICATED_PATCH | Freq: Once | TRANSDERMAL | Status: DC
  Administered 2016-04-07: 1.5 mg via TRANSDERMAL

## 2016-04-07 MED ORDER — ONDANSETRON HCL 4 MG/2ML IJ SOLN
INTRAMUSCULAR | Status: AC
  Filled 2016-04-07: qty 2

## 2016-04-07 MED ORDER — ROCURONIUM BROMIDE 100 MG/10ML IV SOLN
INTRAVENOUS | Status: DC | PRN
  Administered 2016-04-07: 30 mg via INTRAVENOUS

## 2016-04-07 MED ORDER — KETOROLAC TROMETHAMINE 30 MG/ML IJ SOLN
INTRAMUSCULAR | Status: AC
  Filled 2016-04-07: qty 1

## 2016-04-07 MED ORDER — ROCURONIUM BROMIDE 100 MG/10ML IV SOLN
INTRAVENOUS | Status: AC
  Filled 2016-04-07: qty 1

## 2016-04-07 MED ORDER — KETOROLAC TROMETHAMINE 30 MG/ML IJ SOLN
INTRAMUSCULAR | Status: DC | PRN
  Administered 2016-04-07: 30 mg via INTRAVENOUS

## 2016-04-07 SURGICAL SUPPLY — 20 items
CATH ROBINSON RED A/P 16FR (CATHETERS) ×2 IMPLANT
CLOTH BEACON ORANGE TIMEOUT ST (SAFETY) ×2 IMPLANT
DRESSING OPSITE X SMALL 2X3 (GAUZE/BANDAGES/DRESSINGS) ×2 IMPLANT
DRSG COVADERM PLUS 2X2 (GAUZE/BANDAGES/DRESSINGS) ×4 IMPLANT
DRSG OPSITE POSTOP 3X4 (GAUZE/BANDAGES/DRESSINGS) IMPLANT
DURAPREP 26ML APPLICATOR (WOUND CARE) ×2 IMPLANT
GLOVE BIOGEL PI IND STRL 7.0 (GLOVE) ×2 IMPLANT
GLOVE BIOGEL PI INDICATOR 7.0 (GLOVE) ×2
GLOVE ECLIPSE 6.5 STRL STRAW (GLOVE) ×2 IMPLANT
GOWN STRL REUS W/TWL LRG LVL3 (GOWN DISPOSABLE) ×4 IMPLANT
NEEDLE INSUFFLATION 120MM (ENDOMECHANICALS) ×2 IMPLANT
PACK LAPAROSCOPY BASIN (CUSTOM PROCEDURE TRAY) ×2 IMPLANT
PAD TRENDELENBURG POSITION (MISCELLANEOUS) ×2 IMPLANT
SLEEVE XCEL OPT CAN 5 100 (ENDOMECHANICALS) ×2 IMPLANT
SUT VICRYL 0 UR6 27IN ABS (SUTURE) ×2 IMPLANT
SUT VICRYL 4-0 PS2 18IN ABS (SUTURE) ×2 IMPLANT
TOWEL OR 17X24 6PK STRL BLUE (TOWEL DISPOSABLE) ×4 IMPLANT
TROCAR OPTI TIP 5M 100M (ENDOMECHANICALS) ×2 IMPLANT
TROCAR XCEL DIL TIP R 11M (ENDOMECHANICALS) ×2 IMPLANT
WATER STERILE IRR 1000ML POUR (IV SOLUTION) ×2 IMPLANT

## 2016-04-07 NOTE — Anesthesia Postprocedure Evaluation (Signed)
Anesthesia Post Note  Patient: Connie [name]  Procedure(s) Performed: Procedure(s) (LRB): LAPAROSCOPIC TUBAL LIGATION With Bipolar Cautery (Bilateral)  Patient location during evaluation: PACU Anesthesia Type: General Level of consciousness: awake and alert Pain management: pain level controlled Vital Signs Assessment: post-procedure vital signs reviewed and stable Respiratory status: spontaneous breathing, nonlabored ventilation, respiratory function stable and patient connected to nasal cannula oxygen Cardiovascular status: blood pressure returned to baseline and stable Postop Assessment: no signs of nausea or vomiting Anesthetic complications: no     Last Vitals:  Vitals:   04/07/16 0945 04/07/16 0958  BP: 111/80   Pulse: 88 94  Resp: 18 16  Temp:  36.9 C    Last Pain:  Vitals:   04/07/16 0958  TempSrc: Oral   Pain Goal: Patients Stated Pain Goal: 7 (04/07/16 [id number])               Rodricus Candelaria L

## 2016-04-07 NOTE — Transfer of Care (Signed)
Immediate Anesthesia Transfer of Care Note  Patient: Connie [name]  Procedure(s) Performed: Procedure(s): LAPAROSCOPIC TUBAL LIGATION With Bipolar Cautery (Bilateral)  Patient Location: PACU  Anesthesia Type:General  Level of Consciousness: awake, alert  and oriented  Airway & Oxygen Therapy: Patient Spontanous Breathing and Patient connected to nasal cannula oxygen  Post-op Assessment: Report given to RN, Post -op Vital signs reviewed and stable and Patient moving all extremities  Post vital signs: Reviewed and stable  Last Vitals:  Vitals:   04/07/16 0613  BP: 120/86  Pulse: 94  Resp: 16  Temp: 36.9 C    Last Pain:  Vitals:   04/07/16 0613  TempSrc: Oral      Patients Stated Pain Goal: 7 (04/07/16 [id number])  Complications: No apparent anesthesia complications

## 2016-04-07 NOTE — H&P (Signed)
Connie [name] is an 35 y.o. female.G2 P2 BF presents for LTL with bipolar cautery.  Request permanent sterilization  Pertinent Gynecological History: Menses: irregular occurring approximately every 28 days without intermenstrual spotting Bleeding: none  Contraception: none DES exposure: denies Blood transfusions: none Sexually transmitted diseases: no past history Previous GYN Procedures: c/s  Last mammogram: n/a Last pap nl. 2017 OB History: G2P2   Menstrual History: Menarche age: n/a Patient's last menstrual period was 03/08/2016 (exact date).    Past Medical History:  Diagnosis Date  . Breast pain, left    burning sensations started 03/28/2016  . Diabetes mellitus without complication (HCC)   . Gestational diabetes   . Insomnia   . Normal vaginal delivery 10/22/2012  . Pregnancy induced hypertension   . VBAC (vaginal birth after Cesarean) 10/22/2012    Past Surgical History:  Procedure Laterality Date  . CESAREAN SECTION    . CYST REMOVAL TRUNK     removed from neck   . WISDOM TOOTH EXTRACTION      Family History  Problem Relation Age of Onset  . Other Neg Hx     Social History:  reports that she has never smoked. She has never used smokeless tobacco. She reports that she drinks alcohol. She reports that she does not use drugs.  Allergies: No Known Allergies  Prescriptions Prior to Admission  Medication Sig Dispense Refill Last Dose  . acetaminophen (TYLENOL) 325 MG tablet Take 650 mg by mouth daily as needed for headache. For headache   04/06/2016 at Unknown time  . Biotin 1 MG CAPS Take 2 capsules by mouth daily.   04/06/2016 at Unknown time  . diphenhydrAMINE (BENADRYL) 25 MG tablet Take 25 mg by mouth every 6 (six) hours as needed.   Past Week at Unknown time  . JANUMET XR [id number] MG TB24 Take 100-1,000 mg by mouth daily.    04/06/2016 at Unknown time  . MELATONIN PO Take by mouth.   04/06/2016 at Unknown time  . montelukast (SINGULAIR) 10 MG tablet Take 10  mg by mouth at bedtime.    04/06/2016 at Unknown time  . albuterol (PROVENTIL HFA;VENTOLIN HFA) 108 (90 BASE) MCG/ACT inhaler Inhale 2 puffs into the lungs every 6 (six) hours as needed for wheezing or shortness of breath. 1 Inhaler 2 More than a month at Unknown time  . ibuprofen (ADVIL,MOTRIN) 200 MG tablet Take 200 mg by mouth every 6 (six) hours as needed for moderate pain.   More than a month at Unknown time    Review of Systems  All other systems reviewed and are negative.   Last menstrual period 03/08/2016. Physical Exam  Constitutional: She is oriented to person, place, and time. She appears well-developed and well-nourished.  HENT:  Head: Atraumatic.  Neck: Neck supple.  Cardiovascular: Regular rhythm.   Respiratory: Breath sounds normal.  GI: Soft. Bowel sounds are normal.  Genitourinary: Vagina normal and uterus normal.  Genitourinary Comments: Vulva nl  Musculoskeletal: Normal range of motion.  Neurological: She is alert and oriented to person, place, and time.  Skin: Skin is warm.  Psychiatric: She has a normal mood and affect.    No results found for this or any previous visit (from the past 24 hour(s)).  No results found.  Assessment/Plan: Desires permanent sterilization DM P) LTL with bipolar cautery. Risk of surgery reviewed including infection, bleeding, injury to underlying organ structures, failure rate 1/500-1/600, nonreversible, permanent. ALL ? answered  Tadeo Besecker A 04/07/2016, 6:15 AM

## 2016-04-07 NOTE — Op Note (Signed)
[name]:  [name], Connie [name]            ACCOUNT NO.:  [id number]  MEDICAL RECORD NO.:  [id number]  LOCATION:  [facility]                          FACILITY:  [facility]  PHYSICIAN:  Maxie BetterSheronette Aydee Mcnew, M.D.DATE OF BIRTH:  07/19/1981  DATE OF PROCEDURE:  04/07/2016 DATE OF DISCHARGE:                              OPERATIVE REPORT   PREOPERATIVE DIAGNOSIS:  Desires sterilization.  POSTOPERATIVE DIAGNOSIS:  Desires sterilization.  PROCEDURE:  Laparoscopic tubal ligation with bipolar cautery.  ANESTHESIA:  General.  SURGEON:  Maxie BetterSheronette Jonne Rote, MD.  ASSISTANT:  None.  DESCRIPTION OF PROCEDURE:  Under adequate general anesthesia, the patient was placed in the dorsal lithotomy position.  She was sterilely prepped and draped in usual fashion.  Bladder was catheterized a small amount of urine.  The patient has voided prior to transfer to the operating room.  Examination revealed anteverted uterus.  No adnexal masses could be appreciated.  A weighted bivalve speculum was placed in the vagina.  A single-tooth tenaculum was placed on the anterior lip of the cervix.  An Acorn cannula was introduced into the cervical os and attached to the tenaculum for manipulation of the uterus.  The bivalve speculum was removed.  The patient was sterilely prepped and draped. Attention was then turned to the abdomen.  A 0.25% Marcaine was injected infraumbilical.  A small infraumbilical vertical incision was then made. Veress needle was introduced.  Opening pressure tested with sterile water.  Opening pressure of 10 was noted.  A 3 L of CO2 was insufflated. Veress needle was then removed.  A 10 mm disposable trocar with sleeve was introduced into the abdomen without incident.  A lighted videolaparoscope was inserted.  Abdomen entry was without incident. Panoramic inspection was done.  Normal liver edge.  The patient was placed in Trendelenburg position.  Pelvis was inspected.  Bowel loops were noted in the posterior  cul-de-sac.  Suprapubic incision was made. A 5 mm port was placed under direct visualization.  A probe was then utilized to displace the bowel superiorly.  The uterus was anteflexed. Both tubes and ovaries were noted to be normal.  Elongated appendix was noted.  Using the bipolar cautery, the midportion of both fallopian tubes was cauterized about 1 cm in length bilaterally.  The procedure was felt to be adequate.  The suprapubic site was removed under direct visualization.  The abdomen was deflated and the infraumbilical port was removed taking care not to bring up any underlying structures.  The fascia was then identified superiorly.  A 0 Vicryl figure-of-eight fascial suture was placed and 4-0 Vicryl subcuticular closures were used.  The instruments in the vagina were removed.  SPECIMENS: none.  ESTIMATED BLOOD LOSS:  Minimal.  COMPLICATION:  None.  The patient tolerated the procedure well, was transferred to recovery in stable condition.     Maxie BetterSheronette Mellanie Bejarano, M.D.     Galt/MEDQ  D:  04/07/2016  T:  04/07/2016  Job:  [id number]

## 2016-04-07 NOTE — Anesthesia Procedure Notes (Signed)
Procedure Name: Intubation Date/Time: 04/07/2016 7:28 AM Performed by: Elgie CongoMALINOVA, Angelamarie Avakian H Pre-anesthesia Checklist: Patient identified, Emergency Drugs available, Suction available and Patient being monitored Patient Re-evaluated:Patient Re-evaluated prior to inductionOxygen Delivery Method: Circle system utilized Preoxygenation: Pre-oxygenation with 100% oxygen Intubation Type: IV induction Ventilation: Mask ventilation without difficulty Laryngoscope Size: Mac and 3 Grade View: Grade I Tube size: 7.0 mm Number of attempts: 1 Airway Equipment and Method: Stylet Placement Confirmation: ETT inserted through vocal cords under direct vision,  positive ETCO2 and breath sounds checked- equal and bilateral Secured at: 21 cm Tube secured with: Tape Dental Injury: Teeth and Oropharynx as per pre-operative assessment

## 2016-04-07 NOTE — Brief Op Note (Signed)
04/07/2016  8:33 AM  PATIENT:  Connie [name]  35 y.o. female  PRE-OPERATIVE DIAGNOSIS:  Desires Sterilization  POST-OPERATIVE DIAGNOSIS:  Desires Sterilization  PROCEDURE:  Procedure(s): LAPAROSCOPIC TUBAL LIGATION With Bipolar Cautery (Bilateral)  SURGEON:  Surgeon(s) and Role:    * Maxie BetterSheronette Anjelika Ausburn, MD - Primary  PHYSICIAN ASSISTANT:   ASSISTANTS: none   ANESTHESIA:   general  EBL:  Total I/O In: 1400 [I.V.:1400] Out: 55 [Urine:50; Blood:5]  BLOOD ADMINISTERED:none  DRAINS: none   LOCAL MEDICATIONS USED:  MARCAINE     SPECIMEN:  No Specimen  DISPOSITION OF SPECIMEN:  N/A  COUNTS:  YES  TOURNIQUET:  * No tourniquets in log *  DICTATION: .Other Dictation: Dictation Number [id number]  PLAN OF CARE: Discharge to home after PACU  PATIENT DISPOSITION:  PACU - hemodynamically stable.   Delay start of Pharmacological VTE agent (>24hrs) due to surgical blood loss or risk of bleeding: no

## 2016-04-07 NOTE — Discharge Instructions (Signed)

## 2016-04-07 NOTE — Anesthesia Preprocedure Evaluation (Addendum)
Anesthesia Evaluation  Patient identified by MRN, date of birth, ID band Patient awake    Reviewed: Allergy & Precautions, H&P , NPO status , Patient's Chart, lab work & pertinent test results  Airway Mallampati: II  TM Distance: >3 FB Neck ROM: full    Dental no notable dental hx. (+) Dental Advisory Given, Teeth Intact   Pulmonary neg pulmonary ROS,    Pulmonary exam normal breath sounds clear to auscultation       Cardiovascular Exercise Tolerance: Good negative cardio ROS Normal cardiovascular exam Rhythm:regular Rate:Normal     Neuro/Psych negative neurological ROS  negative psych ROS   GI/Hepatic negative GI ROS, Neg liver ROS,   Endo/Other  negative endocrine ROSdiabetes, Well Controlled, Type 2, Oral Hypoglycemic Agents  Renal/GU negative Renal ROS  negative genitourinary   Musculoskeletal   Abdominal   Peds  Hematology negative hematology ROS (+)   Anesthesia Other Findings   Reproductive/Obstetrics negative OB ROS                            Anesthesia Physical Anesthesia Plan  ASA: II  Anesthesia Plan: General   Post-op Pain Management:    Induction: Intravenous  Airway Management Planned: Oral ETT  Additional Equipment:   Intra-op Plan:   Post-operative Plan: Extubation in OR  Informed Consent: I have reviewed the patients History and Physical, chart, labs and discussed the procedure including the risks, benefits and alternatives for the proposed anesthesia with the patient or authorized representative who has indicated his/her understanding and acceptance.   Dental Advisory Given  Plan Discussed with: CRNA  Anesthesia Plan Comments:         Anesthesia Quick Evaluation

## 2016-04-10 ENCOUNTER — Encounter (HOSPITAL_COMMUNITY): Payer: Self-pay | Admitting: Obstetrics and Gynecology

## 2016-06-24 IMAGING — CR DG CHEST 1V
1 series · 1 of 1 positions shown · non-contrast
Comparison: 11/08/2014 and 09/18/2013 chest radiographs

CLINICAL DATA: Positive TB test.

EXAM:
CHEST  1 VIEW

[w chest pa]
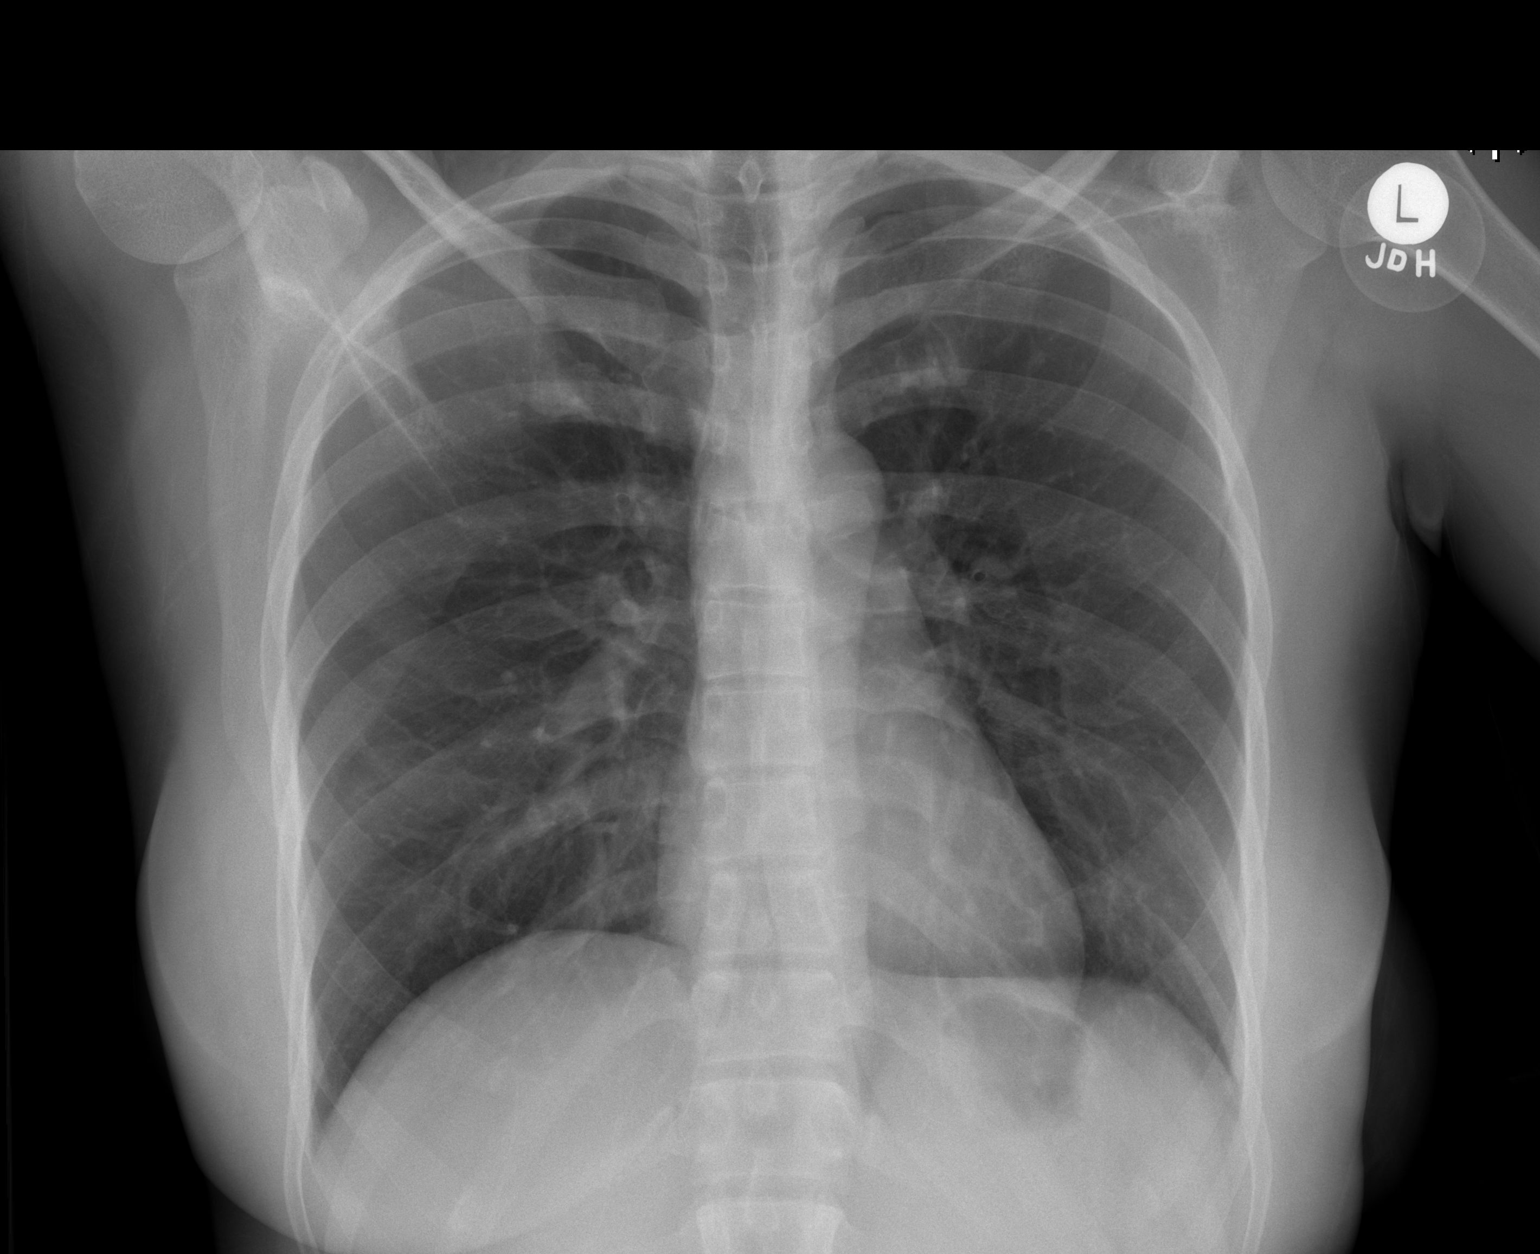

[1 of 1 positions shown; findings below may reference images not displayed]

FINDINGS: The cardiomediastinal silhouette is unremarkable.

There is no evidence of focal airspace disease, pulmonary edema,
suspicious pulmonary nodule/mass, pleural effusion, or pneumothorax.
No acute bony abnormalities are identified.
IMPRESSION: No active disease.

## 2016-08-04 ENCOUNTER — Encounter (HOSPITAL_COMMUNITY): Payer: Self-pay

## 2016-08-04 ENCOUNTER — Emergency Department (HOSPITAL_COMMUNITY)
Admission: EM | Admit: 2016-08-04 | Discharge: 2016-08-04 | Disposition: A | Discharge status: Home / Self Care | Attending: Dermatology | Admitting: Dermatology

## 2016-08-04 DIAGNOSIS — Z5321 Procedure and treatment not carried out due to patient leaving prior to being seen by health care provider: Secondary | ICD-10-CM | POA: Insufficient documentation

## 2016-08-04 DIAGNOSIS — M545 Low back pain: Secondary | ICD-10-CM | POA: Diagnosis present
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
## 2016-11-07 ENCOUNTER — Other Ambulatory Visit: Payer: Self-pay | Admitting: Dermatology

## 2020-09-05 ENCOUNTER — Other Ambulatory Visit

## 2020-09-05 DIAGNOSIS — Z20822 Contact with and (suspected) exposure to covid-19: Secondary | ICD-10-CM

## 2020-09-07 LAB — SARS-COV-2, NAA 2 DAY TAT

## 2020-09-07 LAB — NOVEL CORONAVIRUS, NAA: SARS-CoV-2, NAA: NOT DETECTED

## 2020-09-18 ENCOUNTER — Other Ambulatory Visit

## 2021-03-10 ENCOUNTER — Ambulatory Visit

## 2021-03-10 NOTE — Addendum Note (Signed)
Addended by: Sherri Rad on: 03/10/2021 08:53 AM   Modules accepted: Orders

## 2021-03-11 ENCOUNTER — Other Ambulatory Visit

## 2022-02-19 ENCOUNTER — Emergency Department (HOSPITAL_COMMUNITY)
Admission: EM | Admit: 2022-02-19 | Discharge: 2022-02-19 | Disposition: A | Discharge status: Home / Self Care | Attending: Emergency Medicine | Admitting: Emergency Medicine

## 2022-02-19 ENCOUNTER — Emergency Department (HOSPITAL_COMMUNITY)

## 2022-02-19 ENCOUNTER — Encounter (HOSPITAL_COMMUNITY): Payer: Self-pay | Admitting: Pharmacy Technician

## 2022-02-19 ENCOUNTER — Other Ambulatory Visit: Payer: Self-pay

## 2022-02-19 DIAGNOSIS — J45909 Unspecified asthma, uncomplicated: Secondary | ICD-10-CM | POA: Diagnosis not present

## 2022-02-19 DIAGNOSIS — M25512 Pain in left shoulder: Secondary | ICD-10-CM | POA: Insufficient documentation

## 2022-02-19 DIAGNOSIS — R739 Hyperglycemia, unspecified: Secondary | ICD-10-CM | POA: Insufficient documentation

## 2022-02-19 LAB — BASIC METABOLIC PANEL
Anion gap: 14 (ref 5–15)
BUN: 16 mg/dL (ref 6–20)
CO2: 23 mmol/L (ref 22–32)
Calcium: 9.4 mg/dL (ref 8.9–10.3)
Chloride: 95 mmol/L — ABNORMAL LOW (ref 98–111)
Creatinine, Ser: 0.73 mg/dL (ref 0.44–1.00)
GFR, Estimated: >60 mL/min (ref >60)
Glucose, Bld: 364 mg/dL — ABNORMAL HIGH (ref 70–99)
Potassium: 3.9 mmol/L (ref 3.5–5.1)
Sodium: 132 mmol/L — ABNORMAL LOW (ref 135–145)

## 2022-02-19 LAB — CBC
HCT: 34.3 % — ABNORMAL LOW (ref 36.0–46.0)
Hemoglobin: 10.8 g/dL — ABNORMAL LOW (ref 12.0–15.0)
MCH: 24.5 pg — ABNORMAL LOW (ref 26.0–34.0)
MCHC: 31.5 g/dL (ref 30.0–36.0)
MCV: 78 fL — ABNORMAL LOW (ref 80.0–100.0)
Platelets: 308 10*3/uL (ref 150–400)
RBC: 4.4 MIL/uL (ref 3.87–5.11)
RDW: 14.3 % (ref 11.5–15.5)
WBC: 9.3 10*3/uL (ref 4.0–10.5)
nRBC: 0 % (ref 0.0–0.2)

## 2022-02-19 LAB — CBG MONITORING, ED: Glucose-Capillary: 276 mg/dL — ABNORMAL HIGH (ref 70–99)

## 2022-02-19 LAB — TROPONIN I (HIGH SENSITIVITY)
Troponin I (High Sensitivity): 3 ng/L (ref <18)
Troponin I (High Sensitivity): <2 ng/L (ref <18)

## 2022-02-19 LAB — I-STAT BETA HCG BLOOD, ED (MC, WL, AP ONLY): I-stat hCG, quantitative: <5 m[IU]/mL (ref <5)

## 2022-02-19 MED ORDER — IOHEXOL 350 MG/ML SOLN
80.0000 mL | Freq: Once | INTRAVENOUS | Status: AC | PRN
  Administered 2022-02-19: 80 mL via INTRAVENOUS

## 2022-02-19 MED ORDER — INSULIN ASPART 100 UNIT/ML IJ SOLN
5.0000 [IU] | Freq: Once | INTRAMUSCULAR | Status: AC
  Administered 2022-02-19: 5 [IU] via SUBCUTANEOUS

## 2022-02-19 MED ORDER — LACTATED RINGERS IV BOLUS
1000.0000 mL | Freq: Once | INTRAVENOUS | Status: AC
  Administered 2022-02-19: 1000 mL via INTRAVENOUS

## 2022-02-19 MED ORDER — METHOCARBAMOL 500 MG PO TABS: 500.0000 mg | ORAL_TABLET | Freq: Three times a day (TID) | ORAL | 0 refills | Status: DC | PRN

## 2022-02-19 MED ORDER — LIDOCAINE 5 % EX PTCH: 1.0000 | MEDICATED_PATCH | CUTANEOUS | 0 refills | Status: DC

## 2022-02-19 MED ORDER — LIDOCAINE 5 % EX PTCH
1.0000 | MEDICATED_PATCH | CUTANEOUS | Status: DC
  Administered 2022-02-19: 1 via TRANSDERMAL
  Filled 2022-02-19: qty 1

## 2022-02-19 MED ORDER — METHOCARBAMOL 500 MG PO TABS
750.0000 mg | ORAL_TABLET | Freq: Once | ORAL | Status: AC
  Administered 2022-02-19: 750 mg via ORAL
  Filled 2022-02-19: qty 2

## 2022-02-19 MED ORDER — OXYCODONE-ACETAMINOPHEN 5-325 MG PO TABS
1.0000 | ORAL_TABLET | Freq: Once | ORAL | Status: AC
  Administered 2022-02-19: 1 via ORAL
  Filled 2022-02-19: qty 1

## 2022-03-01 ENCOUNTER — Other Ambulatory Visit: Payer: Self-pay | Admitting: Family Medicine

## 2022-03-01 DIAGNOSIS — M4722 Other spondylosis with radiculopathy, cervical region: Secondary | ICD-10-CM

## 2022-03-07 ENCOUNTER — Ambulatory Visit
Admission: RE | Admit: 2022-03-07 | Discharge: 2022-03-07 | Disposition: A | Discharge status: Home / Self Care | Source: Ambulatory Visit | Attending: Family Medicine | Admitting: Family Medicine

## 2022-03-07 DIAGNOSIS — M4722 Other spondylosis with radiculopathy, cervical region: Secondary | ICD-10-CM

## 2022-03-07 MED ORDER — TRIAMCINOLONE ACETONIDE 40 MG/ML IJ SUSP (RADIOLOGY)
60.0000 mg | Freq: Once | INTRAMUSCULAR | Status: AC
  Administered 2022-03-07: 60 mg via EPIDURAL

## 2022-03-07 MED ORDER — IOPAMIDOL (ISOVUE-M 300) INJECTION 61%
15.0000 mL | Freq: Once | INTRAMUSCULAR | Status: AC | PRN
  Administered 2022-03-07: 1 mL via INTRATHECAL

## 2022-03-07 MED ORDER — IOPAMIDOL (ISOVUE-M 200) INJECTION 41%: 1.0000 mL | Freq: Once | INTRAMUSCULAR | Status: DC

## 2022-03-07 NOTE — Discharge Instructions (Signed)

## 2022-03-14 ENCOUNTER — Other Ambulatory Visit: Payer: Self-pay | Admitting: Orthopedic Surgery

## 2022-04-03 NOTE — Progress Notes (Addendum)
Surgical Instructions    Your procedure is scheduled on Wednesday August 16.  Report to [facility] Main Entrance "A" at 10 A.M., then check in with the Admitting office.  Call this number if you have problems the morning of surgery:  [phone]   If you have any questions prior to your surgery date call [phone]: Open Monday-Friday 8am-4pm    Remember:  Do not eat after midnight the night before your surgery  You may drink clear liquids until 9:00am the morning of your surgery.   Clear liquids allowed are: Water, Non-Citrus Juices (without pulp), Carbonated Beverages, Clear Tea, Black Coffee ONLY (NO MILK, CREAM OR POWDERED CREAMER of any kind), and Gatorade Patient Instructions  The night before surgery:  No food after midnight. ONLY clear liquids after midnight  The day of surgery (if you have diabetes): Drink ONE (1) 12 oz G2 given to you in your pre admission testing appointment by 9:00am the morning of surgery. Drink in one sitting. Do not sip.  This drink was given to you during your hospital  pre-op appointment visit.  Nothing else to drink after completing the  12 oz bottle of G2.        If you have questions, please contact your surgeon's office.     Take these medicines the morning of surgery with A SIP OF WATER:  cyclobenzaprine (FLEXERIL)if needed doxycycline (VIBRAMYCIN) metoprolol succinate (TOPROL-XL) omeprazole (PRILOSEC) PERCOCET if needed  As of today, STOP taking any Aspirin (unless otherwise instructed by your surgeon) Aleve, Naproxen, Ibuprofen, Motrin, Advil, Goody's, BC's, all herbal medications, fish oil, and all vitamins.  WHAT DO I DO ABOUT MY DIABETES MEDICATION?   Do not take oral diabetes medicines (pills) the morning of surgery. DO NOT TAKE JANUMET XR THE MORNING OF SURGERY.  THE NIGHT BEFORE SURGERY, take 12 units of LANTUS insulin.      If your CBG is greater than 220 mg/dL, you may take  of your sliding scale (correction) dose of  insulin ON THE MORNING OF SURGERY.    HOW TO MANAGE YOUR DIABETES BEFORE AND AFTER SURGERY  Why is it important to control my blood sugar before and after surgery? Improving blood sugar levels before and after surgery helps healing and can limit problems. A way of improving blood sugar control is eating a healthy diet by:  Eating less sugar and carbohydrates  Increasing activity/exercise  Talking with your doctor about reaching your blood sugar goals High blood sugars (greater than 180 mg/dL) can raise your risk of infections and slow your recovery, so you will need to focus on controlling your diabetes during the weeks before surgery. Make sure that the doctor who takes care of your diabetes knows about your planned surgery including the date and location.  How do I manage my blood sugar before surgery? Check your blood sugar at least 4 times a day, starting 2 days before surgery, to make sure that the level is not too high or low.  Check your blood sugar the morning of your surgery when you wake up and every 2 hours until you get to the Short Stay unit.  If your blood sugar is less than 70 mg/dL, you will need to treat for low blood sugar: Do not take insulin. Treat a low blood sugar (less than 70 mg/dL) with  cup of clear juice (cranberry or apple), 4 glucose tablets, OR glucose gel. Recheck blood sugar in 15 minutes after treatment (to make sure it is greater than  70 mg/dL). If your blood sugar is not greater than 70 mg/dL on recheck, call [phone] for further instructions. Report your blood sugar to the short stay nurse when you get to Short Stay.  If you are admitted to the hospital after surgery: Your blood sugar will be checked by the staff and you will probably be given insulin after surgery (instead of oral diabetes medicines) to make sure you have good blood sugar levels. The goal for blood sugar control after surgery is 80-180 mg/dL.          Do not wear jewelry or  makeup. Do not wear lotions, powders, perfumes/cologne or deodorant. Do not shave 48 hours prior to surgery.  Men may shave face and neck. Do not bring valuables to the hospital. Do not wear nail polish, gel polish, artificial nails, or any other type of covering on natural nails (fingers and toes) If you have artificial nails or gel coating that need to be removed by a nail salon, please have this removed prior to surgery. Artificial nails or gel coating may interfere with anesthesia's ability to adequately monitor your vital signs.  [facility] is not responsible for any belongings or valuables.    Do NOT Smoke (Tobacco/Vaping)  24 hours prior to your procedure  If you use a CPAP at night, you may bring your mask for your overnight stay.   Contacts, glasses, hearing aids, dentures or partials may not be worn into surgery, please bring cases for these belongings   For patients admitted to the hospital, discharge time will be determined by your treatment team.   Patients discharged the day of surgery will not be allowed to drive home, and someone needs to stay with them for 24 hours.   SURGICAL WAITING ROOM VISITATION Patients having surgery or a procedure may have no more than 2 support people in the waiting area - these visitors may rotate.   Children under the age of 15 must have an adult with them who is not the patient. If the patient needs to stay at the hospital during part of their recovery, the visitor guidelines for inpatient rooms apply. Pre-op nurse will coordinate an appropriate time for 1 support person to accompany patient in pre-op.  This support person may not rotate.   Please refer to the [facility] website for the visitor guidelines for Inpatients (after your surgery is over and you are in a regular room).    Special instructions:    Oral Hygiene is also important to reduce your risk of infection.  Remember - BRUSH YOUR TEETH THE MORNING OF SURGERY WITH YOUR REGULAR  TOOTHPASTE   [facility]- Preparing For Surgery  Before surgery, you can play an important role. Because skin is not sterile, your skin needs to be as free of germs as possible. You can reduce the number of germs on your skin by washing with CHG (chlorahexidine gluconate) Soap before surgery.  CHG is an antiseptic cleaner which kills germs and bonds with the skin to continue killing germs even after washing.     Please do not use if you have an allergy to CHG or antibacterial soaps. If your skin becomes reddened/irritated stop using the CHG.  Do not shave (including legs and underarms) for at least 48 hours prior to first CHG shower. It is OK to shave your face.  Please follow these instructions carefully.     Shower the NIGHT BEFORE SURGERY and the MORNING OF SURGERY with CHG Soap.  If you chose to wash your hair, wash your hair first as usual with your normal shampoo. After you shampoo, rinse your hair and body thoroughly to remove the shampoo.  Then [employer] and genitals (private parts) with your normal soap and rinse thoroughly to remove soap.  After that Use CHG Soap as you would any other liquid soap. You can apply CHG directly to the skin and wash gently with a scrungie or a clean washcloth.   Apply the CHG Soap to your body ONLY FROM THE NECK DOWN.  Do not use on open wounds or open sores. Avoid contact with your eyes, ears, mouth and genitals (private parts). Wash Face and genitals (private parts)  with your normal soap.   Wash thoroughly, paying special attention to the area where your surgery will be performed.  Thoroughly rinse your body with warm water from the neck down.  DO NOT shower/wash with your normal soap after using and rinsing off the CHG Soap.  Pat yourself dry with a CLEAN TOWEL.  Wear CLEAN PAJAMAS to bed the night before surgery  Place CLEAN SHEETS on your bed the night before your surgery  DO NOT SLEEP WITH PETS.   Day of Surgery:  Take a shower  with CHG soap. Wear Clean/Comfortable clothing the morning of surgery Do not apply any deodorants/lotions.   Remember to brush your teeth WITH YOUR REGULAR TOOTHPASTE.    If you received a COVID test during your pre-op visit, it is requested that you wear a mask when out in public, stay away from anyone that may not be feeling well, and notify your surgeon if you develop symptoms. If you have been in contact with anyone that has tested positive in the last 10 days, please notify your surgeon.    Please read over the following fact sheets that you were given.

## 2022-04-04 ENCOUNTER — Encounter (HOSPITAL_COMMUNITY): Payer: Self-pay

## 2022-04-04 ENCOUNTER — Other Ambulatory Visit: Payer: Self-pay

## 2022-04-04 ENCOUNTER — Encounter (HOSPITAL_COMMUNITY)
Admission: RE | Admit: 2022-04-04 | Discharge: 2022-04-04 | Disposition: A | Discharge status: Home / Self Care | Source: Ambulatory Visit | Attending: Orthopedic Surgery | Admitting: Orthopedic Surgery

## 2022-04-04 VITALS — BP 131/87 | HR 102 | Temp 98.6°F | Resp 18 | Ht 70.0 in | Wt 151.5 lb

## 2022-04-04 DIAGNOSIS — Z01818 Encounter for other preprocedural examination: Secondary | ICD-10-CM | POA: Diagnosis present

## 2022-04-04 LAB — CBC
HCT: 33.5 % — ABNORMAL LOW (ref 36.0–46.0)
Hemoglobin: 10.4 g/dL — ABNORMAL LOW (ref 12.0–15.0)
MCH: 24.4 pg — ABNORMAL LOW (ref 26.0–34.0)
MCHC: 31 g/dL (ref 30.0–36.0)
MCV: 78.6 fL — ABNORMAL LOW (ref 80.0–100.0)
Platelets: 270 10*3/uL (ref 150–400)
RBC: 4.26 MIL/uL (ref 3.87–5.11)
RDW: 15.3 % (ref 11.5–15.5)
WBC: 7.9 10*3/uL (ref 4.0–10.5)
nRBC: 0 % (ref 0.0–0.2)

## 2022-04-04 LAB — BASIC METABOLIC PANEL
Anion gap: 10 (ref 5–15)
BUN: 9 mg/dL (ref 6–20)
CO2: 26 mmol/L (ref 22–32)
Calcium: 9.2 mg/dL (ref 8.9–10.3)
Chloride: 100 mmol/L (ref 98–111)
Creatinine, Ser: 0.57 mg/dL (ref 0.44–1.00)
GFR, Estimated: >60 mL/min (ref >60)
Glucose, Bld: 144 mg/dL — ABNORMAL HIGH (ref 70–99)
Potassium: 4.5 mmol/L (ref 3.5–5.1)
Sodium: 136 mmol/L (ref 135–145)

## 2022-04-04 LAB — HEMOGLOBIN A1C
Hgb A1c MFr Bld: 7.8 % — ABNORMAL HIGH (ref 4.8–5.6)
Mean Plasma Glucose: 177.16 mg/dL

## 2022-04-04 LAB — GLUCOSE, CAPILLARY: Glucose-Capillary: 137 mg/dL — ABNORMAL HIGH (ref 70–99)

## 2022-04-04 LAB — SURGICAL PCR SCREEN
MRSA, PCR: NEGATIVE
Staphylococcus aureus: NEGATIVE

## 2022-04-04 NOTE — Progress Notes (Addendum)
PCP - Georgianne Fick, MD Cardiologist - Margit Banda. Mercy Riding, MD  PPM/ICD - denies Device Orders - n/a Rep Notified - n/a  Chest x-ray - 02/19/2022 EKG - 02/19/2022 Stress Test - denies ECHO - denies Cardiac Cath - denies  Sleep Study - denies CPAP - n/a  Fasting Blood Sugar - 136 - 180 Checks Blood Sugar - patient has CGM CBG today - 137 A1C - done in PAT on 04/04/22  Blood Thinner Instructions: n/a  Aspirin Instructions: Patient was instructed: As of today, STOP taking any Aspirin (unless otherwise instructed by your surgeon) Aleve, Naproxen, Ibuprofen, Motrin, Advil, Goody's, BC's, all herbal medications, fish oil, and all vitamins.  ERAS Protcol - yes PRE-SURGERY - G2- yes, until 09:00 o'clock  COVID TEST- n/a   Anesthesia review: yes - EKG review; history of tachycardia (patient said that after pregnancy she has tachycardia and she will go to see Dr. Margit Banda. Field seismologist at [facility]). No acute distress today, other then shoulder and neck pain.   Patient denies shortness of breath, fever, cough and chest pain at PAT appointment   All instructions explained to the patient, with a verbal understanding of the material. Patient agrees to go over the instructions while at home for a better understanding. Patient also instructed to self quarantine after being tested for COVID-19. The opportunity to ask questions was provided.

## 2022-04-07 NOTE — Anesthesia Preprocedure Evaluation (Signed)
Anesthesia Evaluation  Patient identified by MRN, date of birth, ID band Patient awake    Reviewed: Allergy & Precautions, NPO status , Patient's Chart, lab work & pertinent test results, reviewed documented beta blocker date and time   History of Anesthesia Complications Negative for: history of anesthetic complications  Airway Mallampati: I  TM Distance: >3 FB Neck ROM: Full    Dental  (+) Teeth Intact, Dental Advisory Given   Pulmonary neg pulmonary ROS,    Pulmonary exam normal        Cardiovascular hypertension, Pt. on medications and Pt. on home beta blockers Normal cardiovascular exam  Per office visit note 04/06/2022, patient had echo 03/20/2022 showing normal LV systolic function, EF 50 to 55%, grade 1 DD, probable patent foraminal valve, no significant valvular abnormalities   Neuro/Psych    GI/Hepatic negative GI ROS, Neg liver ROS,   Endo/Other  diabetes, Type 2, Insulin Dependent  Renal/GU negative Renal ROS  negative genitourinary   Musculoskeletal negative musculoskeletal ROS (+)   Abdominal   Peds  Hematology  (+) Blood dyscrasia, anemia ,   Anesthesia Other Findings   Reproductive/Obstetrics                           Anesthesia Physical Anesthesia Plan  ASA: 3  Anesthesia Plan: General   Post-op Pain Management: Tylenol PO (pre-op)* and Toradol IV (intra-op)*   Induction: Intravenous  PONV Risk Score and Plan: 3 and Ondansetron, Dexamethasone, Treatment may vary due to age or medical condition and Midazolam  Airway Management Planned: Oral ETT  Additional Equipment: None  Intra-op Plan:   Post-operative Plan: Extubation in OR  Informed Consent: I have reviewed the patients History and Physical, chart, labs and discussed the procedure including the risks, benefits and alternatives for the proposed anesthesia with the patient or authorized representative who has  indicated his/her understanding and acceptance.     Dental advisory given  Plan Discussed with:   Anesthesia Plan Comments: (PAT note by Antionette Poles, PA-C: Patient sees cardiologist Dr. Mercy Riding at [facility] for history of tachycardia.  Per office visit note 04/06/2022, patient had echo 03/20/2022 showing normal LV systolic function, EF 50 to 55%, grade 1 DD, probable patent foraminal valve, no significant valvular abnormalities.  Note also states she had event monitor July 2023 which showed predominant rhythm to be sinus tachycardia.  She was started metoprolol succinate 50mg  once daily. Dr. subsequently cleared patient for surgery at low risk.  Copy of clearance on chart.  IDDM 2, A1c 7.8 on preop labs.  Preop labs reviewed, mild anemia with hemoglobin 10.4.  EKG 03/16/2022 [facility], copy on chart): Sinus tachycardia.  Rate 105.  EKG 01/28/2022: Sinus tachycardia rate 107.  Nonspecific T wave abnormality.  TTE 03/20/2022 (copy on chart): 1.  LV size, wall thickness and systolic function are normal. 2.  Overall left ventricular systolic function is low normal; LVEF 50 to 55%. 3.  Grade 1 diastolic dysfunction; filling pattern indeterminate, suggestive of impaired relaxation. 4.  Probable patent foramen ovale with no evidence of significant shunt; if clinically indicated, correlation with transesophageal echo or cardiac CT could be considered. 5.  Pulmonary venous Doppler flow pattern shows systolic blunting, suggestive of elevated LA pressure. 6.  The left atrial size was normal; LA volume 22 mL/m. 7.  The right ventricle is normal in size and function. 8.  Insufficient TR for accurate assessment of PA pressure; best estimate of  RVSP is in the 20 to 25 mmHg range. 9.  All valves appear structurally competent with no evidence of hemodynamically significant valvular abnormality. )       Anesthesia Quick Evaluation

## 2022-04-07 NOTE — Progress Notes (Signed)
Anesthesia Chart Review:  Patient sees cardiologist Dr. Mercy Riding at [facility] medical for history of tachycardia.  Per office visit note 04/06/2022, patient had echo 03/20/2022 showing normal LV systolic function, EF 50 to 55%, grade 1 DD, probable patent foraminal valve, no significant valvular abnormalities.  Note also states she had event monitor July 2023 which showed predominant rhythm to be sinus tachycardia.  She was started metoprolol succinate 50mg  once daily. Dr. subsequently cleared patient for surgery at low risk.  Copy of clearance on chart.  IDDM 2, A1c 7.8 on preop labs.  Preop labs reviewed, mild anemia with hemoglobin 10.4.  EKG 03/16/2022 [facility], copy on chart): Sinus tachycardia.  Rate 105.  EKG 01/28/2022: Sinus tachycardia rate 107.  Nonspecific T wave abnormality.  TTE 03/20/2022 (copy on chart): 1.  LV size, wall thickness and systolic function are normal. 2.  Overall left ventricular systolic function is low normal; LVEF 50 to 55%. 3.  Grade 1 diastolic dysfunction; filling pattern indeterminate, suggestive of impaired relaxation. 4.  Probable patent foramen ovale with no evidence of significant shunt; if clinically indicated, correlation with transesophageal echo or cardiac CT could be considered. 5.  Pulmonary venous Doppler flow pattern shows systolic blunting, suggestive of elevated LA pressure. 6.  The left atrial size was normal; LA volume 22 mL/m. 7.  The right ventricle is normal in size and function. 8.  Insufficient TR for accurate assessment of PA pressure; best estimate of RVSP is in the 20 to 25 mmHg range. 9.  All valves appear structurally competent with no evidence of hemodynamically significant valvular abnormality.    03/22/2022 [facility]/Anesthesiology Phone [phone] 04/07/2022 10:19 AM

## 2022-04-11 ENCOUNTER — Ambulatory Visit: Admit: 2022-04-11 | Admitting: Orthopedic Surgery

## 2022-04-11 SURGERY — ANTERIOR CERVICAL DECOMPRESSION/DISCECTOMY FUSION 1 LEVEL
Anesthesia: General

## 2022-04-12 ENCOUNTER — Encounter (HOSPITAL_COMMUNITY): Payer: Self-pay | Admitting: Orthopedic Surgery

## 2022-04-12 ENCOUNTER — Encounter (HOSPITAL_COMMUNITY)
Admission: RE | Disposition: A | Discharge status: Home / Self Care | Payer: Self-pay | Source: Home / Self Care | Attending: Orthopedic Surgery

## 2022-04-12 ENCOUNTER — Ambulatory Visit (HOSPITAL_COMMUNITY)
Admission: RE | Admit: 2022-04-12 | Discharge: 2022-04-12 | Disposition: A | Discharge status: Home / Self Care | Attending: Orthopedic Surgery | Admitting: Orthopedic Surgery

## 2022-04-12 ENCOUNTER — Ambulatory Visit (HOSPITAL_COMMUNITY)

## 2022-04-12 ENCOUNTER — Other Ambulatory Visit: Payer: Self-pay

## 2022-04-12 ENCOUNTER — Ambulatory Visit (HOSPITAL_COMMUNITY): Admitting: Physician Assistant

## 2022-04-12 ENCOUNTER — Ambulatory Visit (HOSPITAL_BASED_OUTPATIENT_CLINIC_OR_DEPARTMENT_OTHER): Admitting: Anesthesiology

## 2022-04-12 DIAGNOSIS — Z79899 Other long term (current) drug therapy: Secondary | ICD-10-CM | POA: Insufficient documentation

## 2022-04-12 DIAGNOSIS — E119 Type 2 diabetes mellitus without complications: Secondary | ICD-10-CM | POA: Insufficient documentation

## 2022-04-12 DIAGNOSIS — M5010 Cervical disc disorder with radiculopathy, unspecified cervical region: Secondary | ICD-10-CM

## 2022-04-12 DIAGNOSIS — Z794 Long term (current) use of insulin: Secondary | ICD-10-CM | POA: Diagnosis not present

## 2022-04-12 DIAGNOSIS — M50123 Cervical disc disorder at C6-C7 level with radiculopathy: Secondary | ICD-10-CM | POA: Insufficient documentation

## 2022-04-12 DIAGNOSIS — I10 Essential (primary) hypertension: Secondary | ICD-10-CM | POA: Insufficient documentation

## 2022-04-12 DIAGNOSIS — M4802 Spinal stenosis, cervical region: Secondary | ICD-10-CM

## 2022-04-12 DIAGNOSIS — M5412 Radiculopathy, cervical region: Secondary | ICD-10-CM | POA: Diagnosis not present

## 2022-04-12 HISTORY — PX: ANTERIOR CERVICAL DECOMP/DISCECTOMY FUSION: SHX1161

## 2022-04-12 LAB — GLUCOSE, CAPILLARY
Glucose-Capillary: 192 mg/dL — ABNORMAL HIGH (ref 70–99)
Glucose-Capillary: 168 mg/dL — ABNORMAL HIGH (ref 70–99)
Glucose-Capillary: 166 mg/dL — ABNORMAL HIGH (ref 70–99)

## 2022-04-12 LAB — POCT PREGNANCY, URINE: Preg Test, Ur: NEGATIVE

## 2022-04-12 SURGERY — ANTERIOR CERVICAL DECOMPRESSION/DISCECTOMY FUSION 1 LEVEL
Anesthesia: General

## 2022-04-12 MED ORDER — PROPOFOL 10 MG/ML IV BOLUS
INTRAVENOUS | Status: AC
  Filled 2022-04-12: qty 20

## 2022-04-12 MED ORDER — DEXAMETHASONE SODIUM PHOSPHATE 10 MG/ML IJ SOLN
INTRAMUSCULAR | Status: AC
Start: 2022-04-12 — End: ?
  Filled 2022-04-12: qty 1

## 2022-04-12 MED ORDER — CEFAZOLIN SODIUM-DEXTROSE 2-4 GM/100ML-% IV SOLN
2.0000 g | INTRAVENOUS | Status: AC
  Administered 2022-04-12: 2 g via INTRAVENOUS
  Filled 2022-04-12: qty 100

## 2022-04-12 MED ORDER — ORAL CARE MOUTH RINSE: 15.0000 mL | Freq: Once | OROMUCOSAL | Status: AC

## 2022-04-12 MED ORDER — THROMBIN 20000 UNITS EX KIT
PACK | CUTANEOUS | Status: DC | PRN
  Administered 2022-04-12: 20 mL via TOPICAL

## 2022-04-12 MED ORDER — INSULIN ASPART 100 UNIT/ML IJ SOLN
0.0000 [IU] | INTRAMUSCULAR | Status: DC | PRN
  Administered 2022-04-12: 2 [IU] via SUBCUTANEOUS
  Filled 2022-04-12: qty 1

## 2022-04-12 MED ORDER — SUGAMMADEX SODIUM 200 MG/2ML IV SOLN
INTRAVENOUS | Status: DC | PRN
  Administered 2022-04-12: 200 mg via INTRAVENOUS

## 2022-04-12 MED ORDER — CHLORHEXIDINE GLUCONATE 0.12 % MT SOLN
15.0000 mL | Freq: Once | OROMUCOSAL | Status: AC
  Administered 2022-04-12: 15 mL via OROMUCOSAL
  Filled 2022-04-12: qty 15

## 2022-04-12 MED ORDER — THROMBIN 20000 UNITS EX SOLR
CUTANEOUS | Status: AC
  Filled 2022-04-12: qty 20000

## 2022-04-12 MED ORDER — MIDAZOLAM HCL 2 MG/2ML IJ SOLN
INTRAMUSCULAR | Status: DC | PRN
  Administered 2022-04-12: 2 mg via INTRAVENOUS

## 2022-04-12 MED ORDER — HYDROCODONE-ACETAMINOPHEN 5-325 MG PO TABS: 1.0000 | ORAL_TABLET | Freq: Four times a day (QID) | ORAL | 0 refills | Status: DC | PRN

## 2022-04-12 MED ORDER — FENTANYL CITRATE (PF) 100 MCG/2ML IJ SOLN: 25.0000 ug | INTRAMUSCULAR | Status: DC | PRN

## 2022-04-12 MED ORDER — LIDOCAINE 2% (20 MG/ML) 5 ML SYRINGE
INTRAMUSCULAR | Status: DC | PRN
  Administered 2022-04-12: 100 mg via INTRAVENOUS

## 2022-04-12 MED ORDER — ACETAMINOPHEN 500 MG PO TABS
1000.0000 mg | ORAL_TABLET | Freq: Once | ORAL | Status: AC
Start: 2022-04-12 — End: 2022-04-12
  Administered 2022-04-12: 1000 mg via ORAL
  Filled 2022-04-12: qty 2

## 2022-04-12 MED ORDER — MIDAZOLAM HCL 2 MG/2ML IJ SOLN
INTRAMUSCULAR | Status: AC
  Filled 2022-04-12: qty 2

## 2022-04-12 MED ORDER — PROPOFOL 10 MG/ML IV BOLUS
INTRAVENOUS | Status: DC | PRN
  Administered 2022-04-12: 200 mg via INTRAVENOUS

## 2022-04-12 MED ORDER — FENTANYL CITRATE (PF) 250 MCG/5ML IJ SOLN
INTRAMUSCULAR | Status: DC | PRN
  Administered 2022-04-12 (×2): 50 ug via INTRAVENOUS
  Administered 2022-04-12: 100 ug via INTRAVENOUS
  Administered 2022-04-12: 50 ug via INTRAVENOUS

## 2022-04-12 MED ORDER — AMISULPRIDE (ANTIEMETIC) 5 MG/2ML IV SOLN: 10.0000 mg | Freq: Once | INTRAVENOUS | Status: DC | PRN

## 2022-04-12 MED ORDER — ONDANSETRON HCL 4 MG/2ML IJ SOLN: 4.0000 mg | Freq: Once | INTRAMUSCULAR | Status: DC | PRN

## 2022-04-12 MED ORDER — ONDANSETRON HCL 4 MG/2ML IJ SOLN
INTRAMUSCULAR | Status: DC | PRN
  Administered 2022-04-12: 4 mg via INTRAVENOUS

## 2022-04-12 MED ORDER — 0.9 % SODIUM CHLORIDE (POUR BTL) OPTIME
TOPICAL | Status: DC | PRN
  Administered 2022-04-12: 1000 mL

## 2022-04-12 MED ORDER — BUPIVACAINE-EPINEPHRINE (PF) 0.25% -1:200000 IJ SOLN
INTRAMUSCULAR | Status: AC
  Filled 2022-04-12: qty 30

## 2022-04-12 MED ORDER — SURGIFLO WITH THROMBIN (HEMOSTATIC MATRIX KIT) OPTIME
TOPICAL | Status: DC | PRN
  Administered 2022-04-12: 1 via TOPICAL

## 2022-04-12 MED ORDER — PHENYLEPHRINE 80 MCG/ML (10ML) SYRINGE FOR IV PUSH (FOR BLOOD PRESSURE SUPPORT)
PREFILLED_SYRINGE | INTRAVENOUS | Status: DC | PRN
  Administered 2022-04-12 (×9): 80 ug via INTRAVENOUS

## 2022-04-12 MED ORDER — ROCURONIUM BROMIDE 10 MG/ML (PF) SYRINGE
PREFILLED_SYRINGE | INTRAVENOUS | Status: AC
  Filled 2022-04-12: qty 10

## 2022-04-12 MED ORDER — LIDOCAINE 2% (20 MG/ML) 5 ML SYRINGE
INTRAMUSCULAR | Status: AC
Start: 2022-04-12 — End: ?
  Filled 2022-04-12: qty 5

## 2022-04-12 MED ORDER — FENTANYL CITRATE (PF) 250 MCG/5ML IJ SOLN
INTRAMUSCULAR | Status: AC
  Filled 2022-04-12: qty 5

## 2022-04-12 MED ORDER — ONDANSETRON HCL 4 MG/2ML IJ SOLN
INTRAMUSCULAR | Status: AC
  Filled 2022-04-12: qty 2

## 2022-04-12 MED ORDER — METHOCARBAMOL 750 MG PO TABS: 750.0000 mg | ORAL_TABLET | Freq: Four times a day (QID) | ORAL | 0 refills | Status: DC | PRN

## 2022-04-12 MED ORDER — POVIDONE-IODINE 7.5 % EX SOLN
Freq: Once | CUTANEOUS | Status: AC
  Filled 2022-04-12: qty 118

## 2022-04-12 MED ORDER — OXYCODONE HCL 5 MG PO TABS: 5.0000 mg | ORAL_TABLET | Freq: Once | ORAL | Status: DC | PRN

## 2022-04-12 MED ORDER — ROCURONIUM BROMIDE 10 MG/ML (PF) SYRINGE
PREFILLED_SYRINGE | INTRAVENOUS | Status: DC | PRN
  Administered 2022-04-12: 80 mg via INTRAVENOUS

## 2022-04-12 MED ORDER — PHENYLEPHRINE HCL-NACL 20-0.9 MG/250ML-% IV SOLN
INTRAVENOUS | Status: DC | PRN
  Administered 2022-04-12: 25 ug/min via INTRAVENOUS

## 2022-04-12 MED ORDER — BUPIVACAINE-EPINEPHRINE 0.25% -1:200000 IJ SOLN
INTRAMUSCULAR | Status: DC | PRN
  Administered 2022-04-12: 3.5 mL

## 2022-04-12 MED ORDER — DEXAMETHASONE SODIUM PHOSPHATE 10 MG/ML IJ SOLN
INTRAMUSCULAR | Status: DC | PRN
  Administered 2022-04-12: 5 mg via INTRAVENOUS

## 2022-04-12 MED ORDER — OXYCODONE HCL 5 MG/5ML PO SOLN: 5.0000 mg | Freq: Once | ORAL | Status: DC | PRN

## 2022-04-12 MED ORDER — LACTATED RINGERS IV SOLN: INTRAVENOUS | Status: DC

## 2022-04-12 SURGICAL SUPPLY — 75 items
BAG COUNTER SPONGE SURGICOUNT (BAG) ×1 IMPLANT
BENZOIN TINCTURE PRP APPL 2/3 (GAUZE/BANDAGES/DRESSINGS) ×1 IMPLANT
BIT DRILL NEURO 2X3.1 SFT TUCH (MISCELLANEOUS) ×1 IMPLANT
BIT DRILL SKYLINE 12MM (BIT) IMPLANT
BLADE CLIPPER SURG (BLADE) ×1 IMPLANT
BLADE SURG 15 STRL LF DISP TIS (BLADE) ×1 IMPLANT
BLADE SURG 15 STRL SS (BLADE) ×1
CARTRIDGE OIL MAESTRO DRILL (MISCELLANEOUS) ×1 IMPLANT
CLOSURE STERI STRIP 1/2 X4 (GAUZE/BANDAGES/DRESSINGS) IMPLANT
COLLAR CERV LO CONTOUR FIRM DE (SOFTGOODS) IMPLANT
CORD BIPOLAR FORCEPS 12FT (ELECTRODE) ×1 IMPLANT
COVER SURGICAL LIGHT HANDLE (MISCELLANEOUS) ×1 IMPLANT
DIFFUSER DRILL AIR PNEUMATIC (MISCELLANEOUS) ×1 IMPLANT
DRAIN JACKSON RD 7FR 3/32 (WOUND CARE) IMPLANT
DRAPE C-ARM 42X72 X-RAY (DRAPES) ×1 IMPLANT
DRAPE POUCH INSTRU U-SHP 10X18 (DRAPES) ×1 IMPLANT
DRAPE SURG 17X23 STRL (DRAPES) ×3 IMPLANT
DRILL BIT SKYLINE 12MM (BIT) ×1
DRILL NEURO 2X3.1 SOFT TOUCH (MISCELLANEOUS) ×1
DURAPREP 26ML APPLICATOR (WOUND CARE) ×1 IMPLANT
ELECT COATED BLADE 2.86 ST (ELECTRODE) ×1 IMPLANT
ELECT REM PT RETURN 9FT ADLT (ELECTROSURGICAL) ×1
ELECTRODE REM PT RTRN 9FT ADLT (ELECTROSURGICAL) ×1 IMPLANT
EVACUATOR SILICONE 100CC (DRAIN) IMPLANT
GAUZE 4X4 16PLY ~~LOC~~+RFID DBL (SPONGE) ×1 IMPLANT
GAUZE SPONGE 4X4 12PLY STRL (GAUZE/BANDAGES/DRESSINGS) ×1 IMPLANT
GLOVE BIO SURGEON STRL SZ7 (GLOVE) ×1 IMPLANT
GLOVE BIO SURGEON STRL SZ8 (GLOVE) ×1 IMPLANT
GLOVE BIOGEL PI IND STRL 7.0 (GLOVE) ×2 IMPLANT
GLOVE BIOGEL PI IND STRL 8 (GLOVE) ×1 IMPLANT
GLOVE BIOGEL PI INDICATOR 7.0 (GLOVE) ×2
GLOVE BIOGEL PI INDICATOR 8 (GLOVE) ×1
GLOVE SURG ENC MOIS LTX SZ6.5 (GLOVE) ×1 IMPLANT
GOWN STRL REUS W/ TWL LRG LVL3 (GOWN DISPOSABLE) ×1 IMPLANT
GOWN STRL REUS W/ TWL XL LVL3 (GOWN DISPOSABLE) ×1 IMPLANT
GOWN STRL REUS W/TWL LRG LVL3 (GOWN DISPOSABLE) ×1
GOWN STRL REUS W/TWL XL LVL3 (GOWN DISPOSABLE) ×1
INTERLOCK LRDTC CRVCL VBR 7MM (Bone Implant) IMPLANT
IV CATH 14GX2 1/4 (CATHETERS) ×1 IMPLANT
KIT BASIN OR (CUSTOM PROCEDURE TRAY) ×1 IMPLANT
KIT TURNOVER KIT B (KITS) ×1 IMPLANT
LORDOTIC CERVICAL VBR 7MM SM (Bone Implant) ×1 IMPLANT
MANIFOLD NEPTUNE II (INSTRUMENTS) ×1 IMPLANT
NDL PRECISIONGLIDE 27X1.5 (NEEDLE) ×1 IMPLANT
NDL SPNL 20GX3.5 QUINCKE YW (NEEDLE) ×1 IMPLANT
NEEDLE PRECISIONGLIDE 27X1.5 (NEEDLE) ×1 IMPLANT
NEEDLE SPNL 20GX3.5 QUINCKE YW (NEEDLE) ×1 IMPLANT
NS IRRIG 1000ML POUR BTL (IV SOLUTION) ×1 IMPLANT
OIL CARTRIDGE MAESTRO DRILL (MISCELLANEOUS) ×1
PACK ORTHO CERVICAL (CUSTOM PROCEDURE TRAY) ×1 IMPLANT
PAD ARMBOARD 7.5X6 YLW CONV (MISCELLANEOUS) ×2 IMPLANT
PATTIES SURGICAL .5 X.5 (GAUZE/BANDAGES/DRESSINGS) IMPLANT
PATTIES SURGICAL .5 X1 (DISPOSABLE) IMPLANT
PIN DISTRACTION 12MM (PIN) ×2
PIN DSTRCT 12XNS SS ACIS (PIN) IMPLANT
PLATE SKYLINE 12MM (Plate) IMPLANT
POSITIONER HEAD DONUT 9IN (MISCELLANEOUS) ×1 IMPLANT
SCREW SKYLINE VARIABLE LG (Screw) IMPLANT
SPONGE INTESTINAL PEANUT (DISPOSABLE) ×1 IMPLANT
SPONGE SURGIFOAM ABS GEL 100 (HEMOSTASIS) IMPLANT
STRIP CLOSURE SKIN 1/2X4 (GAUZE/BANDAGES/DRESSINGS) ×1 IMPLANT
SURGIFLO W/THROMBIN 8M KIT (HEMOSTASIS) IMPLANT
SUT MNCRL AB 4-0 PS2 18 (SUTURE) ×1 IMPLANT
SUT SILK 4 0 (SUTURE)
SUT SILK 4-0 18XBRD TIE 12 (SUTURE) IMPLANT
SUT VIC AB 2-0 CT2 18 VCP726D (SUTURE) ×1 IMPLANT
SYR BULB IRRIG 60ML STRL (SYRINGE) ×1 IMPLANT
SYR CONTROL 10ML LL (SYRINGE) ×2 IMPLANT
TAPE CLOTH 4X10 WHT NS (GAUZE/BANDAGES/DRESSINGS) ×1 IMPLANT
TAPE CLOTH SURG 4X10 WHT LF (GAUZE/BANDAGES/DRESSINGS) IMPLANT
TAPE UMBILICAL COTTON 1/8X30 (MISCELLANEOUS) ×1 IMPLANT
TOWEL GREEN STERILE (TOWEL DISPOSABLE) ×1 IMPLANT
TOWEL GREEN STERILE FF (TOWEL DISPOSABLE) ×1 IMPLANT
WATER STERILE IRR 1000ML POUR (IV SOLUTION) ×1 IMPLANT
YANKAUER SUCT BULB TIP NO VENT (SUCTIONS) ×1 IMPLANT

## 2022-04-12 NOTE — H&P (Signed)
PREOPERATIVE H&P  Chief Complaint: Left arm pain  HPI: Connie [name] is a 41 y.o. female who presents with ongoing pain in the left arm  MRI reveals a left C6/7 disc herniation, compressing the left C7 nerve  Patient has failed multiple forms of conservative care and continues to have pain (see office notes for additional details regarding the patient's full course of treatment)  Past Medical History:  Diagnosis Date   Breast pain, left    burning sensations started 03/28/2016   Diabetes mellitus without complication (HCC)    Gestational diabetes    Insomnia    Normal vaginal delivery 10/22/2012   Pregnancy induced hypertension    Tachycardia    VBAC (vaginal birth after Cesarean) 10/22/2012   Past Surgical History:  Procedure Laterality Date   CESAREAN SECTION     CYST REMOVAL TRUNK     removed from neck    LAPAROSCOPIC TUBAL LIGATION Bilateral 04/07/2016   Procedure: LAPAROSCOPIC TUBAL LIGATION With Bipolar Cautery;  Surgeon: Maxie Better, MD;  Location: [facility];  Service: Gynecology;  Laterality: Bilateral;   WISDOM TOOTH EXTRACTION     Social History   Socioeconomic History   Marital status: Married    Spouse name: Not on file   Number of children: Not on file   Years of education: Not on file   Highest education level: Not on file  Occupational History   Not on file  Tobacco Use   Smoking status: Never   Smokeless tobacco: Never  Substance and Sexual Activity   Alcohol use: Yes   Drug use: No   Sexual activity: Yes    Birth control/protection: None  Other Topics Concern   Not on file  Social History Narrative   Not on file   Social Determinants of Health   Financial Resource Strain: Not on file  Food Insecurity: Not on file  Transportation Needs: Not on file  Physical Activity: Not on file  Stress: Not on file  Social Connections: Not on file   Family History  Problem Relation Age of Onset   Other Neg Hx    No Known  Allergies Prior to Admission medications   Medication Sig Start Date End Date Taking? Authorizing Provider  cyclobenzaprine (FLEXERIL) 5 MG tablet Take 5-10 mg by mouth at bedtime as needed for muscle spasms. 03/06/22  Yes [provider]  diphenhydrAMINE HCl, Sleep, 50 MG CAPS Take 50 mg by mouth at bedtime as needed (sleep).   Yes [provider]  doxycycline (VIBRAMYCIN) 100 MG capsule Take 100 mg by mouth daily.   Yes [provider]  gabapentin (NEURONTIN) 300 MG capsule Take 300 mg by mouth at bedtime. 03/18/22  Yes [provider]  HUMALOG KWIKPEN 100 UNIT/ML KwikPen Inject 6-8 Units into the skin with breakfast, with lunch, and with evening meal. 03/14/22  Yes [provider]  IBU 600 MG tablet Take 600 mg by mouth 3 (three) times daily as needed for pain. 02/24/22  Yes [provider]  JANUMET XR [phone] MG TB24 Take 1 tablet by mouth daily. 01/08/16  Yes [provider]  LANTUS SOLOSTAR 100 UNIT/ML Solostar Pen Inject 24 Units into the skin at bedtime. 02/10/22  Yes [provider]  lidocaine (LIDODERM) 5 % Place 1 patch onto the skin daily. Remove & Discard patch within 12 hours or as directed by MD Patient taking differently: Place 1 patch onto the skin daily as needed (pain). Remove & Discard patch within  12 hours or as directed by MD 02/19/22  Yes Gloris Manchester, MD  metoprolol succinate (TOPROL-XL) 50 MG 24 hr tablet Take 100 mg by mouth daily. 03/25/22  Yes [provider]  montelukast (SINGULAIR) 10 MG tablet Take 10 mg by mouth at bedtime.  01/08/16  Yes [provider]  omeprazole (PRILOSEC) 40 MG capsule Take 40 mg by mouth daily. 12/18/21  Yes [provider]  PERCOCET 5-325 MG tablet Take 1 tablet by mouth 3 (three) times daily as needed for pain. 02/20/22  Yes [provider]  Vitamin D, Ergocalciferol, (DRISDOL) 1.25 MG (50000 UNIT) CAPS capsule Take 50,000 Units by mouth every  Sunday. 12/18/21  Yes [provider]  methocarbamol (ROBAXIN) 500 MG tablet Take 1 tablet (500 mg total) by mouth every 8 (eight) hours as needed for muscle spasms. Patient not taking: Reported on 03/28/2022 02/19/22   Gloris Manchester, MD     All other systems have been reviewed and were otherwise negative with the exception of those mentioned in the HPI and as above.  Physical Exam: There were no vitals filed for this visit.  There is no height or weight on file to calculate BMI.  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  This is resulting in left arm pain and weakness. Plan for Procedure(s): ANTERIOR CERVICAL DECOMPRESSION FUSION CERVICAL 6 - CERVICAL 7 WITH INSTRUMENTATION AND ALLOGRAFT   Jackelyn Hoehn, MD 04/12/2022 8:35 AM

## 2022-04-12 NOTE — Anesthesia Procedure Notes (Signed)
Procedure Name: Intubation Date/Time: 04/12/2022 2:19 PM  Performed by: Michele Rockers, CRNAPre-anesthesia Checklist: Patient identified, Patient being monitored, Timeout performed, Emergency Drugs available and Suction available Patient Re-evaluated:Patient Re-evaluated prior to induction Oxygen Delivery Method: Circle system utilized Preoxygenation: Pre-oxygenation with 100% oxygen Induction Type: IV induction Ventilation: Mask ventilation without difficulty Laryngoscope Size: Mac, 3 and Glidescope Grade View: Grade I Tube type: Oral Tube size: 7.0 mm Number of attempts: 1 Airway Equipment and Method: Stylet Placement Confirmation: ETT inserted through vocal cords under direct vision, positive ETCO2 and breath sounds checked- equal and bilateral Secured at: 20 cm Tube secured with: Tape Dental Injury: Teeth and Oropharynx as per pre-operative assessment

## 2022-04-12 NOTE — Transfer of Care (Signed)
Immediate Anesthesia Transfer of Care Note  Patient: Connie [name]  Procedure(s) Performed: ANTERIOR CERVICAL DECOMPRESSION FUSION CERVICAL SIX - CERVICAL SEVEN WITH INSTRUMENTATION AND ALLOGRAFT  Patient Location: PACU  Anesthesia Type:General  Level of Consciousness: awake, alert  and oriented  Airway & Oxygen Therapy: Patient Spontanous Breathing and Patient connected to nasal cannula oxygen  Post-op Assessment: Report given to RN and Post -op Vital signs reviewed and stable  Post vital signs: Reviewed and stable  Last Vitals:  Vitals Value Taken Time  BP 138/86 04/12/22 1555  Temp    Pulse 85 04/12/22 1559  Resp 17 04/12/22 1559  SpO2 98 % 04/12/22 1559  Vitals shown include unvalidated device data.  Last Pain:  Vitals:   04/12/22 1053  TempSrc:   PainSc: 6       Patients Stated Pain Goal: 2 (04/12/22 1053)  Complications: No notable events documented.

## 2022-04-12 NOTE — Anesthesia Postprocedure Evaluation (Signed)
Anesthesia Post Note  Patient: Connie [name]  Procedure(s) Performed: ANTERIOR CERVICAL DECOMPRESSION FUSION CERVICAL SIX - CERVICAL SEVEN WITH INSTRUMENTATION AND ALLOGRAFT     Patient location during evaluation: PACU Anesthesia Type: General Level of consciousness: awake and alert Pain management: pain level controlled Vital Signs Assessment: post-procedure vital signs reviewed and stable Respiratory status: spontaneous breathing, nonlabored ventilation and respiratory function stable Cardiovascular status: blood pressure returned to baseline and stable Postop Assessment: no apparent nausea or vomiting Anesthetic complications: no   No notable events documented.  Last Vitals:  Vitals:   04/12/22 1630 04/12/22 1700  BP: 118/81 134/89  Pulse: 84 81  Resp: 18 14  Temp:  36.6 C  SpO2: 98% 100%    Last Pain:  Vitals:   04/12/22 1700  TempSrc:   PainSc: 2                  Lucretia Kern

## 2022-04-12 NOTE — Op Note (Signed)
PATIENT NAME: Connie [name]   MEDICAL RECORD NO.:   [id number]    DATE OF BIRTH: 1980-12-08   DATE OF PROCEDURE: 04/12/2022                               OPERATIVE REPORT     PREOPERATIVE DIAGNOSES: 1. Left-sided cervical radiculopathy. 2. Spinal stenosis spanning C6/7.   POSTOPERATIVE DIAGNOSES: 1. Left-sided cervical radiculopathy. 2. Spinal stenosis spanning C6/7.   PROCEDURE: 1. Anterior cervical decompression and fusion C6/7. 2. Placement of anterior instrumentation, C6/7. 3. Insertion of interbody device x1 (81mm Titan intervertebral spacer). 4. Intraoperative use of fluoroscopy. 5. Use of morselized allograft - ViviGen.   SURGEON:  Estill Bamberg, MD   ASSISTANT:  Jason Coop, PA-C.   ANESTHESIA:  General endotracheal anesthesia.   COMPLICATIONS:  None.   DISPOSITION:  Stable.   ESTIMATED BLOOD LOSS:  Minimal.   INDICATIONS FOR SURGERY:  Briefly, Ms. Signer is a pleasant 41 -year- old female, who did present to me with severe pain in her neck and left arm.   The patient's MRI did reveal the findings noted above.  Given the ongoing rather debilitating pain and lack of improvement with appropriate treatment measures, we did discuss proceeding with the procedure noted above.  The patient was fully aware of the risks and limitations of surgery as outlined in my preoperative note.   OPERATIVE DETAILS:  On 04/12/2022, the patient was brought to surgery and general endotracheal anesthesia was administered.  The patient was placed supine on the hospital bed. The neck was gently extended.  All bony prominences were meticulously padded.  The neck was prepped and draped in the usual sterile fashion.  At this point, I did make a left-sided transverse incision.  The platysma was incised.  A Smith-Robinson approach was used and the anterior spine was identified. A self-retaining retractor was placed.  I then subperiosteally exposed the vertebral bodies from C6-C7.   Caspar pins were then placed into the C6 and C7 vertebral bodies and distraction was applied.  A thorough and complete C6-7 intervertebral diskectomy was performed.  The posterior longitudinal ligament was identified and entered using a nerve hook.  I then used #1 followed by #2 Kerrison to perform a thorough and complete intervertebral diskectomy.  There were readily obvious herniated disc fragments in the region of the left hemicord and exiting left C7 nerve root.  These were removed in their entirety, uneventfully. The spinal canal was thoroughly decompressed, as was the left neuroforamen.  The endplates were then prepared and the appropriate-sized intervertebral spacer was then packed with ViviGen and tamped into position in the usual fashion. The Caspar pins  then were removed and bone wax was placed in their place.  The appropriate-sized anterior cervical plate was placed over the anterior spine.  12 mm variable angle screws were placed, 2 in each vertebral body from C6-C7 for a total of 4 vertebral body screws.  The screws were then locked to the plate using the Cam locking mechanism.  I was very pleased with the final fluoroscopic images.  The wound was then irrigated.  The wound was then explored for any undue bleeding and there was no bleeding noted. The wound was then closed in layers using 2-0 Vicryl, followed by 4-0 Monocryl.  Benzoin and Steri-Strips were applied, followed by sterile dressing.  All instrument counts were correct at the termination of the procedure.   Of note, Kayla  McKenzie, PA-C, was my assistant throughout surgery, and did aid in retraction, placement of the hardware, suctioning, and closure from start to finish.       Estill Bamberg, MD

## 2022-04-17 ENCOUNTER — Encounter (HOSPITAL_COMMUNITY): Payer: Self-pay | Admitting: Orthopedic Surgery

## 2022-08-16 ENCOUNTER — Encounter (HOSPITAL_BASED_OUTPATIENT_CLINIC_OR_DEPARTMENT_OTHER): Payer: Self-pay | Admitting: Obstetrics and Gynecology

## 2022-08-16 NOTE — Progress Notes (Signed)
Spoke w/ via phone for pre-op interview--- pt Lab needs dos---- [employer], urine preg              Lab results------ current EKG w/ chart (printed copy from media in epic) COVID test -----patient states asymptomatic no test needed Arrive at ------- 0530 on 08-18-2022  NPO after MN NO Solid Food.  Clear liquids from MN until--- 0430 Med rec completed Medications to take morning of surgery ----- prilosec Diabetic medication ----- do not do humalog insulin morning of surgery.  Do half dose lantus insulin night before surgery Patient instructed no nail polish to be worn day of surgery Patient instructed to bring photo id and insurance card day of surgery Patient aware to have Driver (ride ) / caregiver    for 24 hours after surgery -- husband, sainvil Patient Special Instructions ----- n/a Pre-Op special Istructions ----- pt has Libre 3 on left upper arm Patient verbalized understanding of instructions that were given at this phone interview. Patient denies shortness of breath, chest pain, fever, cough at this phone interview.

## 2022-08-17 ENCOUNTER — Other Ambulatory Visit: Payer: Self-pay | Admitting: Obstetrics and Gynecology

## 2022-08-17 ENCOUNTER — Encounter (HOSPITAL_BASED_OUTPATIENT_CLINIC_OR_DEPARTMENT_OTHER): Payer: Self-pay | Admitting: Obstetrics and Gynecology

## 2022-08-17 NOTE — Anesthesia Preprocedure Evaluation (Addendum)
Anesthesia Evaluation  Patient identified by MRN, date of birth, ID band Patient awake    Reviewed: Allergy & Precautions, NPO status , Patient's Chart, lab work & pertinent test results, reviewed documented beta blocker date and time   Airway Mallampati: II  TM Distance: >3 FB Neck ROM: Limited    Dental no notable dental hx. (+) Teeth Intact   Pulmonary neg pulmonary ROS   Pulmonary exam normal breath sounds clear to auscultation       Cardiovascular hypertension, Pt. on medications and Pt. on home beta blockers Normal cardiovascular exam Rhythm:Regular Rate:Normal     Neuro/Psych negative neurological ROS  negative psych ROS   GI/Hepatic Neg liver ROS,GERD  Medicated,,  Endo/Other  diabetes, Poorly Controlled, Type 2, Oral Hypoglycemic Agents, Insulin Dependent    Renal/GU negative Renal ROS  negative genitourinary   Musculoskeletal Cervical DDD S/P ACDF    Abdominal   Peds  Hematology  (+) Blood dyscrasia, anemia   Anesthesia Other Findings   Reproductive/Obstetrics Menorrhagia                             Anesthesia Physical Anesthesia Plan  ASA: 2  Anesthesia Plan: General   Post-op Pain Management: Precedex and Tylenol PO (pre-op)*   Induction: Intravenous  PONV Risk Score and Plan: 4 or greater and Treatment may vary due to age or medical condition, Midazolam, Scopolamine patch - Pre-op and Ondansetron  Airway Management Planned: LMA  Additional Equipment: None  Intra-op Plan:   Post-operative Plan: Extubation in OR  Informed Consent: I have reviewed the patients History and Physical, chart, labs and discussed the procedure including the risks, benefits and alternatives for the proposed anesthesia with the patient or authorized representative who has indicated his/her understanding and acceptance.     Dental advisory given  Plan Discussed with: Anesthesiologist and  CRNA  Anesthesia Plan Comments:         Anesthesia Quick Evaluation

## 2022-08-18 ENCOUNTER — Encounter (HOSPITAL_BASED_OUTPATIENT_CLINIC_OR_DEPARTMENT_OTHER): Payer: Self-pay | Admitting: Obstetrics and Gynecology

## 2022-08-18 ENCOUNTER — Ambulatory Visit (HOSPITAL_BASED_OUTPATIENT_CLINIC_OR_DEPARTMENT_OTHER)
Admission: RE | Admit: 2022-08-18 | Discharge: 2022-08-18 | Disposition: A | Discharge status: Home / Self Care | Attending: Obstetrics and Gynecology | Admitting: Obstetrics and Gynecology

## 2022-08-18 ENCOUNTER — Encounter (HOSPITAL_BASED_OUTPATIENT_CLINIC_OR_DEPARTMENT_OTHER)
Admission: RE | Disposition: A | Discharge status: Home / Self Care | Payer: Self-pay | Source: Home / Self Care | Attending: Obstetrics and Gynecology

## 2022-08-18 ENCOUNTER — Other Ambulatory Visit: Payer: Self-pay

## 2022-08-18 ENCOUNTER — Ambulatory Visit (HOSPITAL_BASED_OUTPATIENT_CLINIC_OR_DEPARTMENT_OTHER): Admitting: Anesthesiology

## 2022-08-18 DIAGNOSIS — I10 Essential (primary) hypertension: Secondary | ICD-10-CM | POA: Diagnosis not present

## 2022-08-18 DIAGNOSIS — N920 Excessive and frequent menstruation with regular cycle: Secondary | ICD-10-CM

## 2022-08-18 DIAGNOSIS — Z794 Long term (current) use of insulin: Secondary | ICD-10-CM | POA: Insufficient documentation

## 2022-08-18 DIAGNOSIS — D509 Iron deficiency anemia, unspecified: Secondary | ICD-10-CM | POA: Diagnosis not present

## 2022-08-18 DIAGNOSIS — Z981 Arthrodesis status: Secondary | ICD-10-CM | POA: Diagnosis not present

## 2022-08-18 DIAGNOSIS — Z302 Encounter for sterilization: Secondary | ICD-10-CM

## 2022-08-18 DIAGNOSIS — E119 Type 2 diabetes mellitus without complications: Secondary | ICD-10-CM | POA: Diagnosis not present

## 2022-08-18 DIAGNOSIS — K219 Gastro-esophageal reflux disease without esophagitis: Secondary | ICD-10-CM | POA: Diagnosis not present

## 2022-08-18 HISTORY — DX: Personal history of other complications of pregnancy, childbirth and the puerperium: Z87.59

## 2022-08-18 HISTORY — DX: Gastro-esophageal reflux disease without esophagitis: K21.9

## 2022-08-18 HISTORY — DX: Personal history of gestational diabetes: Z86.32

## 2022-08-18 HISTORY — DX: Excessive and frequent menstruation with regular cycle: N92.0

## 2022-08-18 HISTORY — DX: Iron deficiency anemia, unspecified: D50.9

## 2022-08-18 HISTORY — DX: Type 2 diabetes mellitus without complications: E11.9

## 2022-08-18 HISTORY — PX: HYSTEROSCOPY WITH NOVASURE: SHX5574

## 2022-08-18 HISTORY — DX: Essential (primary) hypertension: I10

## 2022-08-18 HISTORY — DX: Dermatitis, unspecified: L30.9

## 2022-08-18 HISTORY — DX: Presence of spectacles and contact lenses: Z97.3

## 2022-08-18 HISTORY — PX: DILATATION & CURETTAGE/HYSTEROSCOPY WITH MYOSURE: SHX6511

## 2022-08-18 HISTORY — DX: Tachycardia, unspecified: R00.0

## 2022-08-18 LAB — POCT PREGNANCY, URINE: Preg Test, Ur: NEGATIVE

## 2022-08-18 LAB — CBC
HCT: 33.6 % — ABNORMAL LOW (ref 36.0–46.0)
Hemoglobin: 10 g/dL — ABNORMAL LOW (ref 12.0–15.0)
MCH: 23.6 pg — ABNORMAL LOW (ref 26.0–34.0)
MCHC: 29.8 g/dL — ABNORMAL LOW (ref 30.0–36.0)
MCV: 79.2 fL — ABNORMAL LOW (ref 80.0–100.0)
Platelets: 229 10*3/uL (ref 150–400)
RBC: 4.24 MIL/uL (ref 3.87–5.11)
RDW: 14.5 % (ref 11.5–15.5)
WBC: 3.6 10*3/uL — ABNORMAL LOW (ref 4.0–10.5)
nRBC: 0 % (ref 0.0–0.2)

## 2022-08-18 LAB — TYPE AND SCREEN
ABO/RH(D): A POS
Antibody Screen: NEGATIVE

## 2022-08-18 LAB — GLUCOSE, CAPILLARY
Glucose-Capillary: 167 mg/dL — ABNORMAL HIGH (ref 70–99)
Glucose-Capillary: 159 mg/dL — ABNORMAL HIGH (ref 70–99)

## 2022-08-18 SURGERY — HYSTEROSCOPY WITH NOVASURE
Anesthesia: General | Site: Vagina

## 2022-08-18 MED ORDER — ACETAMINOPHEN 10 MG/ML IV SOLN
INTRAVENOUS | Status: AC
  Filled 2022-08-18: qty 100

## 2022-08-18 MED ORDER — SODIUM CHLORIDE 0.9 % IR SOLN
Status: DC | PRN
  Administered 2022-08-18: 3000 mL

## 2022-08-18 MED ORDER — PHENYLEPHRINE 80 MCG/ML (10ML) SYRINGE FOR IV PUSH (FOR BLOOD PRESSURE SUPPORT)
PREFILLED_SYRINGE | INTRAVENOUS | Status: DC | PRN
  Administered 2022-08-18: 80 ug via INTRAVENOUS

## 2022-08-18 MED ORDER — MIDAZOLAM HCL 2 MG/2ML IJ SOLN
INTRAMUSCULAR | Status: AC
  Filled 2022-08-18: qty 2

## 2022-08-18 MED ORDER — PROPOFOL 10 MG/ML IV BOLUS
INTRAVENOUS | Status: DC | PRN
  Administered 2022-08-18: 150 mg via INTRAVENOUS

## 2022-08-18 MED ORDER — ROCURONIUM BROMIDE 10 MG/ML (PF) SYRINGE
PREFILLED_SYRINGE | INTRAVENOUS | Status: AC
  Filled 2022-08-18: qty 10

## 2022-08-18 MED ORDER — PROPOFOL 10 MG/ML IV BOLUS
INTRAVENOUS | Status: AC
  Filled 2022-08-18: qty 20

## 2022-08-18 MED ORDER — OXYCODONE HCL 5 MG PO TABS: 5.0000 mg | ORAL_TABLET | Freq: Once | ORAL | Status: DC | PRN

## 2022-08-18 MED ORDER — ARTIFICIAL TEARS OPHTHALMIC OINT
TOPICAL_OINTMENT | OPHTHALMIC | Status: AC
  Filled 2022-08-18: qty 3.5

## 2022-08-18 MED ORDER — IBUPROFEN 800 MG PO TABS: 800.0000 mg | ORAL_TABLET | Freq: Three times a day (TID) | ORAL | 11 refills | Status: AC | PRN

## 2022-08-18 MED ORDER — ONDANSETRON HCL 4 MG/2ML IJ SOLN: 4.0000 mg | Freq: Once | INTRAMUSCULAR | Status: DC | PRN

## 2022-08-18 MED ORDER — DEXMEDETOMIDINE HCL IN NACL 400 MCG/100ML IV SOLN
INTRAVENOUS | Status: DC | PRN
  Administered 2022-08-18: 8 ug via INTRAVENOUS

## 2022-08-18 MED ORDER — DEXMEDETOMIDINE HCL IN NACL 80 MCG/20ML IV SOLN
INTRAVENOUS | Status: AC
  Filled 2022-08-18: qty 20

## 2022-08-18 MED ORDER — ONDANSETRON HCL 4 MG/2ML IJ SOLN
INTRAMUSCULAR | Status: DC | PRN
  Administered 2022-08-18: 4 mg via INTRAVENOUS

## 2022-08-18 MED ORDER — KETOROLAC TROMETHAMINE 30 MG/ML IJ SOLN
INTRAMUSCULAR | Status: AC
  Filled 2022-08-18: qty 1

## 2022-08-18 MED ORDER — ACETAMINOPHEN 10 MG/ML IV SOLN
INTRAVENOUS | Status: DC | PRN
  Administered 2022-08-18: 1000 mg via INTRAVENOUS

## 2022-08-18 MED ORDER — FENTANYL CITRATE (PF) 100 MCG/2ML IJ SOLN
INTRAMUSCULAR | Status: DC | PRN
  Administered 2022-08-18: 50 ug via INTRAVENOUS

## 2022-08-18 MED ORDER — SOD CITRATE-CITRIC ACID 500-334 MG/5ML PO SOLN: 30.0000 mL | ORAL | Status: DC

## 2022-08-18 MED ORDER — SCOPOLAMINE 1 MG/3DAYS TD PT72
MEDICATED_PATCH | TRANSDERMAL | Status: AC
  Filled 2022-08-18: qty 1

## 2022-08-18 MED ORDER — HYDROMORPHONE HCL 1 MG/ML IJ SOLN
0.2500 mg | INTRAMUSCULAR | Status: DC | PRN
  Administered 2022-08-18 (×2): 0.25 mg via INTRAVENOUS

## 2022-08-18 MED ORDER — ONDANSETRON HCL 4 MG/2ML IJ SOLN
INTRAMUSCULAR | Status: AC
  Filled 2022-08-18: qty 2

## 2022-08-18 MED ORDER — LACTATED RINGERS IV SOLN: INTRAVENOUS | Status: DC

## 2022-08-18 MED ORDER — OXYCODONE HCL 5 MG/5ML PO SOLN: 5.0000 mg | Freq: Once | ORAL | Status: DC | PRN

## 2022-08-18 MED ORDER — POVIDONE-IODINE 10 % EX SWAB: 2.0000 | Freq: Once | CUTANEOUS | Status: DC

## 2022-08-18 MED ORDER — AMISULPRIDE (ANTIEMETIC) 5 MG/2ML IV SOLN: 10.0000 mg | Freq: Once | INTRAVENOUS | Status: DC | PRN

## 2022-08-18 MED ORDER — MIDAZOLAM HCL 2 MG/2ML IJ SOLN
INTRAMUSCULAR | Status: DC | PRN
  Administered 2022-08-18: 2 mg via INTRAVENOUS

## 2022-08-18 MED ORDER — HYDROMORPHONE HCL 1 MG/ML IJ SOLN
INTRAMUSCULAR | Status: AC
  Filled 2022-08-18: qty 1

## 2022-08-18 MED ORDER — LIDOCAINE 2% (20 MG/ML) 5 ML SYRINGE
INTRAMUSCULAR | Status: DC | PRN
  Administered 2022-08-18: 80 mg via INTRAVENOUS

## 2022-08-18 MED ORDER — SCOPOLAMINE 1 MG/3DAYS TD PT72
1.0000 | MEDICATED_PATCH | TRANSDERMAL | Status: DC
  Administered 2022-08-18: 1.5 mg via TRANSDERMAL

## 2022-08-18 MED ORDER — FENTANYL CITRATE (PF) 100 MCG/2ML IJ SOLN
INTRAMUSCULAR | Status: AC
  Filled 2022-08-18: qty 2

## 2022-08-18 MED ORDER — KETOROLAC TROMETHAMINE 30 MG/ML IJ SOLN
INTRAMUSCULAR | Status: DC | PRN
  Administered 2022-08-18: 30 mg via INTRAVENOUS

## 2022-08-18 MED ORDER — OXYCODONE-ACETAMINOPHEN 5-325 MG PO TABS: 1.0000 | ORAL_TABLET | Freq: Four times a day (QID) | ORAL | 0 refills | Status: AC | PRN

## 2022-08-18 MED ORDER — LIDOCAINE HCL (PF) 2 % IJ SOLN
INTRAMUSCULAR | Status: AC
  Filled 2022-08-18: qty 5

## 2022-08-18 SURGICAL SUPPLY — 15 items
ABLATOR SURESOUND NOVASURE (ABLATOR) IMPLANT
DEVICE MYOSURE LITE (MISCELLANEOUS) IMPLANT
GAUZE PETROLATUM 1 X8 (GAUZE/BANDAGES/DRESSINGS) IMPLANT
GLOVE BIOGEL PI IND STRL 6.5 (GLOVE) IMPLANT
GLOVE BIOGEL PI IND STRL 7.0 (GLOVE) ×1 IMPLANT
GLOVE ECLIPSE 6.5 STRL STRAW (GLOVE) ×1 IMPLANT
GLOVE SURG SS PI 7.0 STRL IVOR (GLOVE) IMPLANT
GOWN STRL REUS W/TWL LRG LVL3 (GOWN DISPOSABLE) ×1 IMPLANT
IV NS IRRIG 3000ML ARTHROMATIC (IV SOLUTION) ×1 IMPLANT
KIT PROCEDURE FLUENT (KITS) ×1 IMPLANT
KIT TURNOVER CYSTO (KITS) ×1 IMPLANT
PACK VAGINAL MINOR WOMEN LF (CUSTOM PROCEDURE TRAY) ×1 IMPLANT
PAD OB MATERNITY 4.3X12.25 (PERSONAL CARE ITEMS) ×1 IMPLANT
PAD PREP 24X48 CUFFED NSTRL (MISCELLANEOUS) ×1 IMPLANT
SEAL ROD LENS SCOPE MYOSURE (ABLATOR) ×2 IMPLANT

## 2022-08-18 NOTE — Anesthesia Postprocedure Evaluation (Signed)
Anesthesia Post Note  Patient: Connie [name]  Procedure(s) Performed: DIAHGNOSTIC HYSTEROSCOPY; ENDOMETRIAL ABLATION (Vagina ) DILATATION & CURETTAGE/HYSTEROSCOPY WITH MYOSURE (Vagina )     Patient location during evaluation: PACU Anesthesia Type: General Level of consciousness: awake and alert and oriented Pain management: pain level controlled Vital Signs Assessment: post-procedure vital signs reviewed and stable Respiratory status: spontaneous breathing, nonlabored ventilation and respiratory function stable Cardiovascular status: blood pressure returned to baseline and stable Postop Assessment: no apparent nausea or vomiting Anesthetic complications: no   No notable events documented.  Last Vitals:  Vitals:   08/18/22 0930 08/18/22 0938  BP: 118/84 120/82  Pulse: 79 80  Resp: 13 18  Temp:  36.6 C  SpO2: 99% 99%    Last Pain:  Vitals:   08/18/22 0929  TempSrc:   PainSc: 6                  Azayla Polo A.

## 2022-08-18 NOTE — Brief Op Note (Signed)
08/18/2022  8:40 AM  PATIENT:  Connie [name]  42 y.o. female  PRE-OPERATIVE DIAGNOSIS:  Menorrhagia with regular cycle, iron defiency anemia  POST-OPERATIVE DIAGNOSIS:  Menorrhagia with regular cycle, iron defiency anemia  PROCEDURE:  diagnostic hysteroscopy, D&C, Novasure endometrial ablation  SURGEON:  Surgeon(s) and Role:    * Maxie Better, MD - Primary  PHYSICIAN ASSISTANT:   ASSISTANTS: none   ANESTHESIA:   general Findings: thickened endometrium EBL:  2 mL   BLOOD ADMINISTERED:none  DRAINS: none   LOCAL MEDICATIONS USED:  NONE  SPECIMEN:  Source of Specimen:  EMC  DISPOSITION OF SPECIMEN:  PATHOLOGY  COUNTS:  YES  TOURNIQUET:  * No tourniquets in log *  DICTATION: .Other Dictation: Dictation Number [id number]  PLAN OF CARE: Discharge to home after PACU  PATIENT DISPOSITION:  PACU - hemodynamically stable.   Delay start of Pharmacological VTE agent (>24hrs) due to surgical blood loss or risk of bleeding: no

## 2022-08-18 NOTE — Interval H&P Note (Signed)
History and Physical Interval Note:  08/18/2022 7:36 AM  Connie [name]  has presented today for surgery, with the diagnosis of Menorrhagia with regular cycle, iron defiency anemia.  The various methods of treatment have been discussed with the patient and family. After consideration of risks, benefits and other options for treatment, the patient has consented to  Diagnostic hysteroscopy, novasure endometrial ablation, dilation and curettage as a surgical intervention.  The patient's history has been reviewed, patient examined, no change in status, stable for surgery.  I have reviewed the patient's chart and labs.  Questions were answered to the patient's satisfaction.     [name] A Momoka Stringfield

## 2022-08-18 NOTE — Anesthesia Procedure Notes (Signed)
Procedure Name: LMA Insertion Date/Time: 08/18/2022 7:50 AM  Performed by: Norva Pavlov, CRNAPre-anesthesia Checklist: Patient identified, Emergency Drugs available, Suction available and Patient being monitored Patient Re-evaluated:Patient Re-evaluated prior to induction Oxygen Delivery Method: Circle system utilized Preoxygenation: Pre-oxygenation with 100% oxygen Induction Type: IV induction Ventilation: Mask ventilation without difficulty LMA: LMA inserted LMA Size: 4.0 Number of attempts: 1 Airway Equipment and Method: Bite block Placement Confirmation: positive ETCO2 Tube secured with: Tape Dental Injury: Teeth and Oropharynx as per pre-operative assessment

## 2022-08-18 NOTE — Discharge Instructions (Addendum)
CALL  IF TEMP>100.4, NOTHING PER VAGINA X 2 WK, CALL IF SOAKING A MAXI  PAD EVERY HOUR OR MORE FREQUENTLY    DISCHARGE INSTRUCTIONS: HYSTEROSCOPY / ENDOMETRIAL ABLATION The following instructions have been prepared to help you care for yourself upon your return home.  May Remove Scop patch on or before Monday 08/21/22  May take Ibuprofen after 4:00 pm today.  May take stool softner while taking narcotic pain medication to prevent constipation.  Drink plenty of water.  Personal hygiene:  Use sanitary pads for vaginal drainage, not tampons.  Shower the day after your procedure.  NO tub baths, pools or Jacuzzis for 2-3 weeks.  Wipe front to back after using the bathroom.  Activity and limitations:  Do NOT drive or operate any equipment for 24 hours. The effects of anesthesia are still present and drowsiness may result.  Do NOT rest in bed all day.  Walking is encouraged.  Walk up and down stairs slowly.  You may resume your normal activity in one to two days or as indicated by your physician. Sexual activity: NO intercourse for at least 2 weeks after the procedure, or as indicated by your Doctor.  Diet: Eat a light meal as desired this evening. You may resume your usual diet tomorrow.  Return to Work: You may resume your work activities in one to two days or as indicated by Therapist, sports.  What to expect after your surgery: Expect to have vaginal bleeding/discharge for 2-3 days and spotting for up to 10 days. It is not unusual to have soreness for up to 1-2 weeks. You may have a slight burning sensation when you urinate for the first day. Mild cramps may continue for a couple of days. You may have a regular period in 2-6 weeks.  Call your doctor for any of the following:  Excessive vaginal bleeding or clotting, saturating and changing one pad every hour.  Inability to urinate 6 hours after discharge from hospital.  Pain not relieved by pain medication.  Fever of 100.4 F or  greater.  Unusual vaginal discharge or odor.    Post Anesthesia Home Care Instructions  Activity: Get plenty of rest for the remainder of the day. A responsible individual must stay with you for 24 hours following the procedure.  For the next 24 hours, DO NOT: -Drive a car -Advertising copywriter -Drink alcoholic beverages -Take any medication unless instructed by your physician -Make any legal decisions or sign important papers.  Meals: Start with liquid foods such as gelatin or soup. Progress to regular foods as tolerated. Avoid greasy, spicy, heavy foods. If nausea and/or vomiting occur, drink only clear liquids until the nausea and/or vomiting subsides. Call your physician if vomiting continues.  Special Instructions/Symptoms: Your throat may feel dry or sore from the anesthesia or the breathing tube placed in your throat during surgery. If this causes discomfort, gargle with warm salt water. The discomfort should disappear within 24 hours.  If you had a scopolamine patch placed behind your ear for the management of post- operative nausea and/or vomiting:  1. The medication in the patch is effective for 72 hours, after which it should be removed.  Wrap patch in a tissue and discard in the trash. Wash hands thoroughly with soap and water. 2. You may remove the patch earlier than 72 hours if you experience unpleasant side effects which may include dry mouth, dizziness or visual disturbances. 3. Avoid touching the patch. Wash your hands with soap and water after  contact with the patch.

## 2022-08-18 NOTE — H&P (Signed)
Connie [name] is an 41 y.o. female. [id number] MBF hx TL presents for surgical mgmt of heavy cycles. Pt is scheduled for endometrial ablation. Pt has had IDA due to menorrhagia. EBX benign path.   Pertinent Gynecological History: Menses: flow is excessive with use of 6 pads or tampons on heaviest days Bleeding: menorrhagia Contraception: tubal ligation DES exposure: denies Blood transfusions: none Sexually transmitted diseases: no past history Previous GYN Procedures:  LTL, C.S   Last mammogram: normal Date: 2023 Last pap: normal Date: 2023 OB History: G2, P2   Menstrual History: Menarche age: n/a Patient's last menstrual period was 07/31/2022 (exact date).    Past Medical History:  Diagnosis Date   Eczema    GERD (gastroesophageal reflux disease)    History of gestational diabetes    History of pregnancy induced hypertension    Hypertension    IDA (iron deficiency anemia)    Insomnia    Insulin dependent type 2 diabetes mellitus [facility])    endocrinologist--- dr Talmage Nap;   (08-16-2022  checks multiple times w/ Josephine Igo 3,   fasting sugar average 120s)   Menorrhagia with regular cycle    Sinus tachycardia    followed by pcp;  per pcp note from 04-06-2022 scanned in epic,  event monitor 07/ 2023  showed predomenately ST;  echo 03-20-2022  ef 50-65%, G1DD, probable PFO no evidence shunt   Wears glasses     Past Surgical History:  Procedure Laterality Date   ANTERIOR CERVICAL DECOMP/DISCECTOMY FUSION N/A 04/12/2022   Procedure: ANTERIOR CERVICAL DECOMPRESSION FUSION CERVICAL SIX - CERVICAL SEVEN WITH INSTRUMENTATION AND ALLOGRAFT;  Surgeon: Estill Bamberg, MD;  Location: [facility] OR;  Service: Orthopedics;  Laterality: N/A;   CESAREAN SECTION  2012   LAPAROSCOPIC TUBAL LIGATION Bilateral 04/07/2016   Procedure: LAPAROSCOPIC TUBAL LIGATION With Bipolar Cautery;  Surgeon: Maxie Better, MD;  Location: [facility] ORS;  Service: Gynecology;  Laterality: Bilateral;   WISDOM TOOTH EXTRACTION       Family History  Problem Relation Age of Onset   Other Neg Hx     Social History:  reports that she has never smoked. She has never used smokeless tobacco. She reports that she does not drink alcohol and does not use drugs.  Allergies: No Known Allergies  No medications prior to admission.    Review of Systems  All other systems reviewed and are negative.   Height 5\' 10"  (1.778 m), weight 65.8 kg, last menstrual period 07/31/2022. Physical Exam Constitutional:      Appearance: Normal appearance.  HENT:     Head: Atraumatic.  Eyes:     Extraocular Movements: Extraocular movements intact.  Cardiovascular:     Rate and Rhythm: Regular rhythm.     Heart sounds: Normal heart sounds.  Pulmonary:     Breath sounds: Normal breath sounds.  Abdominal:     Comments: Pfannenstiel skin incision  Genitourinary:    General: Normal vulva.     Comments: Vagina nl  Cervix parous Uterus nl Adnexa nl Musculoskeletal:        General: Normal range of motion.     Cervical back: Neck supple.  Skin:    General: Skin is warm and dry.  Neurological:     Mental Status: She is alert and oriented to person, place, and time.  Psychiatric:        Mood and Affect: Mood normal.        Behavior: Behavior normal.    No results found for this or any previous visit (  from the past 24 hour(s)).  No results found.  Assessment/Plan: Menorrhagia with regular cycles DM P) dx hysteroscopy, Novasure endometrial ablation., D&C. Procedure explained. Risk of surgery reviewed including infection, bleeding, injury to surrounding organ structures, thermal injury. 90% reduction in flow, all? naswered  Saud Bail A Sheryl Towell 08/18/2022, 12:35 AM

## 2022-08-18 NOTE — Progress Notes (Signed)
Lab reported BMP hemolyzed. Reported to MD and MDA, no repeat necessary.

## 2022-08-18 NOTE — Transfer of Care (Signed)
Immediate Anesthesia Transfer of Care Note  Patient: Connie [name]  Procedure(s) Performed: Procedure(s) (LRB): DIAHGNOSTIC HYSTEROSCOPY; ENDOMETRIAL ABLATION (N/A) DILATATION & CURETTAGE/HYSTEROSCOPY WITH MYOSURE (N/A)  Patient Location: PACU  Anesthesia Type: General  Level of Consciousness: awake, alert  and oriented  Airway & Oxygen Therapy: Patient Spontanous Breathing and Patient connected to nasal cannula oxygen  Post-op Assessment: Report given to PACU RN and Post -op Vital signs reviewed and stable  Post vital signs: Reviewed and stable  Complications: No apparent anesthesia complications  Last Vitals:  Vitals Value Taken Time  BP    Temp    Pulse 85 08/18/22 0833  Resp 12 08/18/22 0833  SpO2 100 % 08/18/22 0833  Vitals shown include unvalidated device data.  Last Pain:  Vitals:   08/18/22 0558  TempSrc: Oral  PainSc: 0-No pain      Patients Stated Pain Goal: 6 (08/18/22 0558)  Complications: No notable events documented.

## 2022-08-19 NOTE — Op Note (Unsigned)
NAMEKORTNI, HASTEN MEDICAL RECORD NO: [id number] ACCOUNT NO: [id number] DATE OF BIRTH: 1980-10-25 FACILITY: [facility] LOCATION: [facility] PHYSICIAN: Mariha Sleeper A. Cherly Hensen, MD  Operative Report   DATE OF PROCEDURE: 08/18/2022  PREOPERATIVE DIAGNOSIS:  Menorrhagia with regular cycles.    PROCEDURE:  Diagnostic hysteroscopy, hysteroscopic resection of endometrium, D and C, and NovaSure endometrial ablation.  POSTOPERATIVE DIAGNOSIS:  Menorrhagia with regular cycles.  ANESTHESIA:  General.  SURGEON:  Thaddius Manes A. Cherly Hensen, MD.  ASSISTANT:  None.  DESCRIPTION OF PROCEDURE:  Under adequate general anesthesia, the patient was placed in the dorsal lithotomy position.  She was sterilely prepped and draped in the usual fashion.  The patient had voided prior to entering the room. Therefore, she was not  catheterized.  Examination under anesthesia revealed anteverted uterus, no adnexal masses could be appreciated.  Bivalve speculum was placed in the vagina.  Single-tooth tenaculum was placed on the anterior lip of the cervix.  The uterus sounded to 7.5  cm.  The endocervical canal, sounded to 3 cm, giving a uterine length of 4.5 cm.  The cervix was then dilated.  The hysteroscope was inserted into the uterine cavity. Thickened endometrium was noted throughout.  Using the LITE resectoscope, the  endometrium was resected.  The resectoscope was then removed.  The cervix was further dilated and the NovaSure endometrial ablation apparatus was inserted into the uterine cavity.  Uterine width of 2.5 cm was encountered. With a power of 73 watts, 1  minute and 21 seconds of ablation was then done.  The endometrial ablation apparatus was then removed.  The diagnostic hysteroscope was then reinserted and the cavity was noted to have good ablation throughout.  At this point, all instruments were then  removed from the vagina.  SPECIMEN:  Endometrial curetting sent to pathology.  ESTIMATED BLOOD LOSS:  5  mL  COMPLICATIONS:  None.   The patient tolerated the procedure well, was transferred to recovery in stable condition.        PAA D: 08/18/2022 11:30:33 pm T: 08/19/2022 12:16:00 am  JOB: [id number]/ [id number]

## 2022-08-22 LAB — SURGICAL PATHOLOGY

## 2022-08-23 ENCOUNTER — Encounter (HOSPITAL_BASED_OUTPATIENT_CLINIC_OR_DEPARTMENT_OTHER): Payer: Self-pay | Admitting: Obstetrics and Gynecology

## 2024-04-30 ENCOUNTER — Other Ambulatory Visit: Payer: Self-pay

## 2024-04-30 ENCOUNTER — Emergency Department (HOSPITAL_BASED_OUTPATIENT_CLINIC_OR_DEPARTMENT_OTHER): Admitting: Radiology

## 2024-04-30 ENCOUNTER — Encounter (HOSPITAL_BASED_OUTPATIENT_CLINIC_OR_DEPARTMENT_OTHER): Payer: Self-pay | Admitting: Emergency Medicine

## 2024-04-30 DIAGNOSIS — I10 Essential (primary) hypertension: Secondary | ICD-10-CM | POA: Insufficient documentation

## 2024-04-30 DIAGNOSIS — Z794 Long term (current) use of insulin: Secondary | ICD-10-CM | POA: Insufficient documentation

## 2024-04-30 DIAGNOSIS — E119 Type 2 diabetes mellitus without complications: Secondary | ICD-10-CM | POA: Insufficient documentation

## 2024-04-30 DIAGNOSIS — R0789 Other chest pain: Secondary | ICD-10-CM | POA: Diagnosis not present

## 2024-04-30 DIAGNOSIS — R1013 Epigastric pain: Secondary | ICD-10-CM | POA: Diagnosis present

## 2024-04-30 DIAGNOSIS — Z79899 Other long term (current) drug therapy: Secondary | ICD-10-CM | POA: Diagnosis not present

## 2024-04-30 NOTE — ED Triage Notes (Signed)
  Patient comes in with chest pain that started around 1700.  Patient states pain starts in middle of chest and radiates to mid back.  Denies any diaphoresis, N/V, or dizziness.  States she gets SOB when the pain starts.  Hx diabetes and has insulin  pump.  Pain 9/10, sharp/stabbing.

## 2024-05-01 ENCOUNTER — Emergency Department (HOSPITAL_BASED_OUTPATIENT_CLINIC_OR_DEPARTMENT_OTHER)
Admission: EM | Admit: 2024-05-01 | Discharge: 2024-05-01 | Disposition: A | Discharge status: Home / Self Care | Attending: Emergency Medicine | Admitting: Emergency Medicine

## 2024-05-01 DIAGNOSIS — R1013 Epigastric pain: Secondary | ICD-10-CM

## 2024-05-01 DIAGNOSIS — R0789 Other chest pain: Secondary | ICD-10-CM

## 2024-05-01 LAB — CBC
HCT: 32.3 % — ABNORMAL LOW (ref 36.0–46.0)
Hemoglobin: 10.5 g/dL — ABNORMAL LOW (ref 12.0–15.0)
MCH: 26.4 pg (ref 26.0–34.0)
MCHC: 32.5 g/dL (ref 30.0–36.0)
MCV: 81.2 fL (ref 80.0–100.0)
Platelets: 225 K/uL (ref 150–400)
RBC: 3.98 MIL/uL (ref 3.87–5.11)
RDW: 14 % (ref 11.5–15.5)
WBC: 6.1 K/uL (ref 4.0–10.5)
nRBC: 0 % (ref 0.0–0.2)

## 2024-05-01 LAB — BASIC METABOLIC PANEL WITH GFR
Anion gap: 10 (ref 5–15)
BUN: 11 mg/dL (ref 6–20)
CO2: 24 mmol/L (ref 22–32)
Calcium: 9 mg/dL (ref 8.9–10.3)
Chloride: 102 mmol/L (ref 98–111)
Creatinine, Ser: 0.65 mg/dL (ref 0.44–1.00)
GFR, Estimated: >60 mL/min (ref >60)
Glucose, Bld: 260 mg/dL — ABNORMAL HIGH (ref 70–99)
Potassium: 4.4 mmol/L (ref 3.5–5.1)
Sodium: 136 mmol/L (ref 135–145)

## 2024-05-01 LAB — HEPATIC FUNCTION PANEL
ALT: 10 U/L (ref 0–44)
AST: 16 U/L (ref 15–41)
Albumin: 4 g/dL (ref 3.5–5.0)
Alkaline Phosphatase: 75 U/L (ref 38–126)
Bilirubin, Direct: <0.1 mg/dL (ref 0.0–0.2)
Total Bilirubin: <0.2 mg/dL (ref 0.0–1.2)
Total Protein: 6.9 g/dL (ref 6.5–8.1)

## 2024-05-01 LAB — LIPASE, BLOOD: Lipase: 21 U/L (ref 11–51)

## 2024-05-01 LAB — TROPONIN T, HIGH SENSITIVITY
Troponin T High Sensitivity: <15 ng/L (ref 0–19)
Troponin T High Sensitivity: <15 ng/L (ref 0–19)

## 2024-05-01 MED ORDER — METOCLOPRAMIDE HCL 5 MG/ML IJ SOLN
10.0000 mg | Freq: Once | INTRAMUSCULAR | Status: AC
  Administered 2024-05-01: 10 mg via INTRAVENOUS
  Filled 2024-05-01: qty 2

## 2024-05-01 MED ORDER — LIDOCAINE VISCOUS HCL 2 % MT SOLN
15.0000 mL | Freq: Once | OROMUCOSAL | Status: AC
  Administered 2024-05-01: 15 mL via ORAL
  Filled 2024-05-01: qty 15

## 2024-05-01 MED ORDER — ALUM & MAG HYDROXIDE-SIMETH 200-200-20 MG/5ML PO SUSP
30.0000 mL | Freq: Once | ORAL | Status: AC
  Administered 2024-05-01: 30 mL via ORAL
  Filled 2024-05-01: qty 30

## 2024-05-01 NOTE — ED Provider Notes (Signed)
 [place] EMERGENCY DEPARTMENT AT [facility] Provider Note  CSN: [id number] Arrival date & time: 04/30/24 2327  Chief Complaint(s) Chest Pain and Shortness of Breath  HPI Connie [name] is a 43 y.o. female     Chest Pain Pain location:  Epigastric Pain quality: throbbing   Pain radiates to:  Mid back Pain severity:  Moderate Timing:  Intermittent (but constant for the past 7 hrs) Chronicity:  Recurrent Relieved by:  Nothing Worsened by:  Nothing Associated symptoms: shortness of breath   Associated symptoms: no fever, no headache, no nausea and no vomiting   Shortness of Breath Associated symptoms: chest pain   Associated symptoms: no fever, no headaches and no vomiting     Past Medical History Past Medical History:  Diagnosis Date   Eczema    GERD (gastroesophageal reflux disease)    History of gestational diabetes    History of pregnancy induced hypertension    Hypertension    IDA (iron deficiency anemia)    Insomnia    Insulin  dependent type 2 diabetes mellitus [facility])    endocrinologist--- dr tommas;   (08-16-2022  checks multiple times w/ Herlene 3,   fasting sugar average 120s)   Menorrhagia with regular cycle    Sinus tachycardia    followed by pcp;  per pcp note from 04-06-2022 scanned in epic,  event monitor 07/ 2023  showed predomenately ST;  echo 03-20-2022  ef 50-65%, G1DD, probable PFO no evidence shunt   Wears glasses    Patient Active Problem List   Diagnosis Date Noted   Encounter for sterilization 04/07/2016   Home Medication(s) Prior to Admission medications   Medication Sig Start Date End Date Taking? Authorizing Provider  diphenhydrAMINE  HCl, Sleep, 50 MG CAPS Take 50 mg by mouth at bedtime as needed (sleep).    [provider]  doxycycline (VIBRAMYCIN) 100 MG capsule Take 100 mg by mouth at bedtime.    [provider]  gabapentin (NEURONTIN) 300 MG capsule Take 300 mg by mouth at bedtime. 03/18/22   [provider]  HUMALOG KWIKPEN 100 UNIT/ML KwikPen Inject 6-8 Units into the skin with breakfast, with lunch, and with evening meal. 03/14/22   [provider]  ibuprofen  (ADVIL ) 800 MG tablet Take 1 tablet (800 mg total) by mouth every 8 (eight) hours as needed for moderate pain or mild pain. 08/18/22   Rutherford Gain, MD  JANUMET XR [phone] MG TB24 Take 1 tablet by mouth at bedtime. 01/08/16   [provider]  LANTUS SOLOSTAR 100 UNIT/ML Solostar Pen Inject 24 Units into the skin at bedtime. 02/10/22   [provider]  metoprolol succinate (TOPROL-XL) 50 MG 24 hr tablet Take 100 mg by mouth at bedtime. 03/25/22   [provider]  montelukast (SINGULAIR) 10 MG tablet Take 10 mg by mouth at bedtime.  01/08/16   [provider]  omeprazole (PRILOSEC) 40 MG capsule Take 40 mg by mouth daily. 12/18/21   [provider]  Vitamin D, Ergocalciferol, (DRISDOL) 1.25 MG (50000 UNIT) CAPS capsule Take 50,000 Units by mouth every Sunday. 12/18/21   [provider]  Allergies Patient has no known allergies.  Review of Systems Review of Systems  Constitutional:  Negative for fever.  Respiratory:  Positive for shortness of breath.   Cardiovascular:  Positive for chest pain.  Gastrointestinal:  Negative for nausea and vomiting.  Neurological:  Negative for headaches.   As noted in HPI  Physical Exam Vital Signs  I have reviewed the triage vital signs BP 114/68 (BP Location: Right Arm)   Pulse 90   Temp 97.6 F (36.4 C) (Oral)   Resp 18   Ht 5' 10 (1.778 m)   Wt 69.9 kg   SpO2 100%   BMI 22.10 kg/m   Physical Exam Vitals reviewed.  Constitutional:      General: She is not in acute distress.    Appearance: She is well-developed. She is not diaphoretic.  HENT:     Head: Normocephalic and atraumatic.      Nose: Nose normal.  Eyes:     General: No scleral icterus.       Right eye: No discharge.        Left eye: No discharge.     Conjunctiva/sclera: Conjunctivae normal.     Pupils: Pupils are equal, round, and reactive to light.  Cardiovascular:     Rate and Rhythm: Normal rate and regular rhythm.     Heart sounds: No murmur heard.    No friction rub. No gallop.  Pulmonary:     Effort: Pulmonary effort is normal. No respiratory distress.     Breath sounds: Normal breath sounds. No stridor. No rales.  Chest:     Chest wall: No tenderness.  Abdominal:     General: There is no distension.     Palpations: Abdomen is soft.     Tenderness: There is abdominal tenderness in the epigastric area. There is no guarding or rebound.  Musculoskeletal:        General: No tenderness.     Cervical back: Normal range of motion and neck supple.  Skin:    General: Skin is warm and dry.     Findings: No erythema or rash.  Neurological:     Mental Status: She is alert and oriented to person, place, and time.     ED Results and Treatments Labs (all labs ordered are listed, but only abnormal results are displayed) Labs Reviewed  BASIC METABOLIC PANEL WITH GFR - Abnormal; Notable for the following components:      Result Value   Glucose, Bld 260 (*)    All other components within normal limits  CBC - Abnormal; Notable for the following components:   Hemoglobin 10.5 (*)    HCT 32.3 (*)    All other components within normal limits  HEPATIC FUNCTION PANEL  LIPASE, BLOOD  TROPONIN T, HIGH SENSITIVITY  TROPONIN T, HIGH SENSITIVITY  EKG  EKG Interpretation Date/Time:  Wednesday April 30 2024 23:36:16 EDT Ventricular Rate:  84 PR Interval:  169 QRS Duration:  82 QT Interval:  360 QTC Calculation: 426 R Axis:   71  Text Interpretation: Sinus rhythm No significant change  was found Confirmed by Trine Likes [phone]) on 05/01/2024 12:37:50 AM       Radiology DG Chest 2 View Result Date: 05/01/2024 CLINICAL DATA:  Chest pain EXAM: CHEST - 2 VIEW COMPARISON:  None Available. FINDINGS: The heart size and mediastinal contours are within normal limits. Both lungs are clear. The visualized skeletal structures are unremarkable. IMPRESSION: No active cardiopulmonary disease. Electronically Signed   By: Dorethia Molt M.D.   On: 05/01/2024 00:13    Medications Ordered in ED Medications  metoCLOPramide  (REGLAN ) injection 10 mg (10 mg Intravenous Given 05/01/24 0131)  alum & mag hydroxide-simeth (MAALOX/MYLANTA) 200-200-20 MG/5ML suspension 30 mL (30 mLs Oral Given 05/01/24 0129)    And  lidocaine  (XYLOCAINE ) 2 % viscous mouth solution 15 mL (15 mLs Oral Given 05/01/24 0129)   Procedures Procedures  (including critical care time) Medical Decision Making / ED Course   Medical Decision Making Amount and/or Complexity of Data Reviewed Labs: ordered. Decision-making details documented in ED Course. Radiology: ordered and independent interpretation performed. Decision-making details documented in ED Course. ECG/medicine tests: ordered and independent interpretation performed. Decision-making details documented in ED Course.  Risk OTC drugs. Prescription drug management.    Patient is here for upper abdominal pain.  Initially intermittent over the past couple days but persistent over the past several hours.  Differential diagnosis considered  EKG without acute ischemic changes or evidence of pericarditis.  Initial troponin negative.  Delta troponin more than 9 hours after pain onset is negative.  Given the constant nature of her pain and atypical features, feel ACS is less likely.  Low suspicion for pulmonary embolism. Chest x-ray without evidence of pneumonia, pneumothorax, pulmonary edema pleural effusions.  No mediastinal abnormalities. Presentation not classic for  dissection or esophageal perforation.  CBC without leukocytosis.  Mild anemia.  No significant electrolyte derangements.  Mild hyperglycemia without DKA.  No bili obstruction or pancreatitis.  Patient provided with GI cocktail which did provide significant relief.  Likely source of her pain    Final Clinical Impression(s) / ED Diagnoses Final diagnoses:  Epigastric pain  Chest discomfort   The patient appears reasonably screened and/or stabilized for discharge and I doubt any other medical condition or other [facility] requiring further screening, evaluation, or treatment in the ED at this time. I have discussed the findings, Dx and Tx plan with the patient/family who expressed understanding and agree(s) with the plan. Discharge instructions discussed at length. The patient/family was given strict return precautions who verbalized understanding of the instructions. No further questions at time of discharge.  Disposition: Discharge  Condition: Good  ED Discharge Orders     None         Follow Up: Primary care provider  Call  to schedule an appointment for close follow up    This chart was dictated using voice recognition software.  Despite best efforts to proofread,  errors can occur which can change the documentation meaning.    Trine Likes Moder, MD 05/01/24 [id number]

## 2024-07-03 ENCOUNTER — Encounter: Payer: Self-pay | Admitting: Cardiology

## 2024-07-03 ENCOUNTER — Ambulatory Visit: Attending: Cardiology | Admitting: Cardiology

## 2024-07-03 VITALS — BP 109/68 | HR 91 | Ht 70.0 in | Wt 156.0 lb

## 2024-07-03 DIAGNOSIS — E782 Mixed hyperlipidemia: Secondary | ICD-10-CM | POA: Insufficient documentation

## 2024-07-03 DIAGNOSIS — E109 Type 1 diabetes mellitus without complications: Secondary | ICD-10-CM | POA: Diagnosis not present

## 2024-07-03 DIAGNOSIS — R072 Precordial pain: Secondary | ICD-10-CM | POA: Diagnosis not present

## 2024-07-03 DIAGNOSIS — R0609 Other forms of dyspnea: Secondary | ICD-10-CM | POA: Diagnosis not present

## 2024-07-03 LAB — CBC

## 2024-07-03 MED ORDER — NITROGLYCERIN 0.4 MG SL SUBL: 0.4000 mg | SUBLINGUAL_TABLET | SUBLINGUAL | 3 refills | Status: AC | PRN

## 2024-07-03 MED ORDER — ASPIRIN 81 MG PO TBEC: 81.0000 mg | DELAYED_RELEASE_TABLET | Freq: Every day | ORAL | 3 refills | Status: AC

## 2024-07-03 MED ORDER — ROSUVASTATIN CALCIUM 10 MG PO TABS: 10.0000 mg | ORAL_TABLET | Freq: Every day | ORAL | 3 refills | Status: DC

## 2024-07-03 NOTE — Progress Notes (Addendum)
 Cardiology Office Note:  .   Date:  07/03/2024  ID:  Connie [name], DOB 1981-04-03, MRN [id number] PCP: Patient, No Pcp Per  [facility] Providers Cardiologist:  Newman Lawrence, MD PCP: Patient, No Pcp Per  Chief Complaint  Patient presents with   Chest discomfort      Connie [name] is a 43 y.o. female with type 1 diabetes mellitus, hyperlipidemia, exertional dyspnea, chest pain  Discussed the use of AI scribe software for clinical note transcription with the patient, who gave verbal consent to proceed.  History of Present Illness Connie [name] is a 43 year old female with diabetes who presents with shortness of breath and chest pain.  Patient is originally from [place], works as a water quality scientist at [employer]. She experiences shortness of breath during physical activity, which is noticeable during her work as a water quality scientist. She discontinued metoprolol 100 mg once daily last week due to dizziness.  Intermittent chest pain occurs throughout the week, lasting a few seconds, and is not associated with exertion. Last month, she visited the emergency room, where an EKG and chest X-ray were performed. She was informed of a possible GERD diagnosis.  She has diabetes, managed with an insulin  pump and Humalog, with improved control from an A1c of 7.9 in March to 6.0. Her family history includes heart problems in her mother and a heart murmur in an uncle. She does not smoke or drink.      Vitals:   07/03/24 0916  BP: 109/68  Pulse: 91  SpO2: 100%      Review of Systems  Cardiovascular:  Positive for chest pain and dyspnea on exertion. Negative for leg swelling, palpitations and syncope.        Studies Reviewed: SABRA        EKG 07/03/2024: Normal sinus rhythm Normal ECG When compared with ECG of 30-Apr-2024 23:36, No significant change was found    Labs 04/2024: Hb 10.5 Cr 0.65 Trop HS <15  10/2023: HbA1C 7.9%  2024: Chol 169, TG 71, HDL 47, LDL  108  2023: HbA1C 7.8%   Physical Exam Vitals and nursing note reviewed.  Constitutional:      General: She is not in acute distress. Neck:     Vascular: No JVD.  Cardiovascular:     Rate and Rhythm: Normal rate and regular rhythm.     Heart sounds: Normal heart sounds. No murmur heard. Pulmonary:     Effort: Pulmonary effort is normal.     Breath sounds: Normal breath sounds. No wheezing or rales.  Musculoskeletal:     Right lower leg: No edema.     Left lower leg: No edema.      VISIT DIAGNOSES:   ICD-10-CM   1. Precordial pain  R07.2 EKG 12-Lead    ECHOCARDIOGRAM COMPLETE    2. Exertional dyspnea  R06.09 ECHOCARDIOGRAM COMPLETE    CBC    Basic metabolic panel with GFR    Brain natriuretic peptide    3. Mixed hyperlipidemia  E78.2 Lipid panel    4. Type 1 diabetes mellitus without complications (HCC)  E10.9        Connie [name] is a 43 y.o. female with type 1 diabetes mellitus, hyperlipidemia, exertional dyspnea, chest pain  Assessment & Plan Exertional dyspnea and chest pain, evaluation for possible coronary artery disease: Intermittent chest pain and exertional dyspnea suggest possible coronary artery disease, especially with diabetes and family history. Soft heart murmur noted, not severe. Diabetic women may present with shortness  of breath as a symptom of coronary artery disease. Recommend CBC, BMP, BNP, echocardiogram, coronary CT angiogram. Given suspicion for obstructive coronary artery disease, started aspirin 81 mg daily, sublingual nitroglycerin as needed. Patient has not tolerated metoprolol in the past due to dizziness, and has not been on for a while.  Therefore, we will hold daily use of metoprolol, which is okay to use metoprolol for the CT scan purpose.  Type 1 diabetes mellitus: Diagnosed in 2020, managed with insulin  pump. Recent HbA1c improved from 7.9% to 6.0%.  Mixed hyperlipidemia: LDL cholesterol was 108 mg/dL over a year ago. Given  diabetes and potential coronary artery disease, aggressive cholesterol management is warranted. - Ordered lipid panel. - Started rosuvastatin 10 mg once daily. - Advised Mediterranean diet.     Meds ordered this encounter  Medications   rosuvastatin (CRESTOR) 10 MG tablet    Sig: Take 1 tablet (10 mg total) by mouth daily.    Dispense:  30 tablet    Refill:  3   nitroGLYCERIN (NITROSTAT) 0.4 MG SL tablet    Sig: Place 1 tablet (0.4 mg total) under the tongue every 5 (five) minutes as needed for chest pain.    Dispense:  25 tablet    Refill:  3   aspirin EC 81 MG tablet    Sig: Take 1 tablet (81 mg total) by mouth daily. Swallow whole.    Dispense:  30 tablet    Refill:  3     F/u in 6 weeks  Signed, Newman JINNY Lawrence, MD

## 2024-07-03 NOTE — Addendum Note (Signed)
 Addended by: ELMIRA NEWMAN PARAS on: 07/03/2024 12:47 PM   Modules accepted: Orders

## 2024-07-03 NOTE — Patient Instructions (Addendum)
 Medication Instructions:     Start Aspirin 81 mg  daily   Start taking Crestor ( rosuvastatin 10 mg daily   Nitroglycerin sublingual as needed fro chest discomfort or pain   *If you need a refill on your cardiac medications before your next appointment, please call your pharmacy*   Lab Work: Lipid Cbc BMP BNP  If you have labs (blood work) drawn today and your tests are completely normal, you will receive your results only by: MyChart Message (if you have MyChart) OR A paper copy in the mail If you have any lab test that is abnormal or we need to change your treatment, we will call you to review the results.   Testing/Procedures: Your physician has requested that you have an echocardiogram. Echocardiography is a painless test that uses sound waves to create images of your heart. It provides your doctor with information about the size and shape of your heart and how well your heart's chambers and valves are working. This procedure takes approximately one hour. There are no restrictions for this procedure. Please do NOT wear cologne, perfume, aftershave, or lotions (deodorant is allowed). Please arrive 15 minutes prior to your appointment time.  Please note: We ask at that you not bring children with you during ultrasound (echo/ vascular) testing. Due to room size and safety concerns, children are not allowed in the ultrasound rooms during exams. Our front office staff cannot provide observation of children in our lobby area while testing is being conducted. An adult accompanying a patient to their appointment will only be allowed in the ultrasound room at the discretion of the ultrasound technician under special circumstances. We apologize for any inconvenience.    Follow-Up: At [facility], you and your health needs are our priority.  As part of our continuing mission to provide you with exceptional heart care, we have created designated Provider Care Teams.  These Care Teams  include your primary Cardiologist (physician) and Advanced Practice Providers (APPs -  Physician Assistants and Nurse Practitioners) who all work together to provide you with the care you need, when you need it.     Your next appointment:   6 week(s)  The format for your next appointment:   In Person  Provider:   Newman JINNY Lawrence, MD or Orren Fabry, PA-C, Dayna Dunn, PA-C, Callie Goodrich, PA-C, Sheng Haley, PA-C, or Kathleen Johnson, PA-C         Other Instructions

## 2024-07-04 ENCOUNTER — Ambulatory Visit: Payer: Self-pay | Admitting: Cardiology

## 2024-07-04 LAB — LIPID PANEL
Chol/HDL Ratio: 4.1 ratio (ref 0.0–4.4)
Cholesterol, Total: 220 mg/dL — ABNORMAL HIGH (ref 100–199)
HDL: 54 mg/dL (ref >39)
LDL Chol Calc (NIH): 153 mg/dL — ABNORMAL HIGH (ref 0–99)
Triglycerides: 76 mg/dL (ref 0–149)
VLDL Cholesterol Cal: 13 mg/dL (ref 5–40)

## 2024-07-04 LAB — BASIC METABOLIC PANEL WITH GFR
BUN/Creatinine Ratio: 17 (ref 9–23)
BUN: 10 mg/dL (ref 6–24)
CO2: 24 mmol/L (ref 20–29)
Calcium: 9.9 mg/dL (ref 8.7–10.2)
Chloride: 99 mmol/L (ref 96–106)
Creatinine, Ser: 0.6 mg/dL (ref 0.57–1.00)
Glucose: 117 mg/dL — ABNORMAL HIGH (ref 70–99)
Potassium: 4.5 mmol/L (ref 3.5–5.2)
Sodium: 137 mmol/L (ref 134–144)
eGFR: 114 mL/min/1.73 (ref >59)

## 2024-07-04 LAB — CBC
Hematocrit: 38.5 % (ref 34.0–46.6)
Hemoglobin: 11.9 g/dL (ref 11.1–15.9)
MCH: 25.6 pg — AB (ref 26.6–33.0)
MCHC: 30.9 g/dL — AB (ref 31.5–35.7)
MCV: 83 fL (ref 79–97)
Platelets: 297 x10E3/uL (ref 150–450)
RBC: 4.64 x10E6/uL (ref 3.77–5.28)
RDW: 12.8 % (ref 11.7–15.4)
WBC: 6.1 x10E3/uL (ref 3.4–10.8)

## 2024-07-04 LAB — BRAIN NATRIURETIC PEPTIDE: BNP: <2.5 pg/mL (ref 0.0–100.0)

## 2024-07-04 NOTE — Progress Notes (Signed)
 Cholesterol is even higher than what it was previously.  Instead of Crestor 10 mg daily prescribed yesterday, recommend Crestor 20 mg daily.  Okay to use up 10 mg pills by taking 2 pills.  Can send new prescription for 20 mg daily at next visit.  Thanks MJP

## 2024-07-07 NOTE — Progress Notes (Signed)
 Please try calling this patient again regarding statin dose, recommend Crestor 20 mg daily.  Thanks MJP

## 2024-07-10 MED ORDER — ROSUVASTATIN CALCIUM 20 MG PO TABS: 20.0000 mg | ORAL_TABLET | Freq: Every day | ORAL | 3 refills | Status: AC

## 2024-07-10 NOTE — Telephone Encounter (Signed)
 Pt returned call - please advise

## 2024-08-07 ENCOUNTER — Ambulatory Visit (HOSPITAL_COMMUNITY)
Admission: RE | Admit: 2024-08-07 | Discharge: 2024-08-07 | Disposition: A | Source: Ambulatory Visit | Attending: Cardiology

## 2024-08-07 DIAGNOSIS — R072 Precordial pain: Secondary | ICD-10-CM | POA: Insufficient documentation

## 2024-08-07 DIAGNOSIS — R0609 Other forms of dyspnea: Secondary | ICD-10-CM | POA: Insufficient documentation

## 2024-08-07 LAB — ECHOCARDIOGRAM COMPLETE
AR max vel: 1.98 cm2
AV Area VTI: 1.93 cm2
AV Area mean vel: 2.21 cm2
AV Mean grad: 2.5 mmHg
AV Peak grad: 4.9 mmHg
Ao pk vel: 1.11 m/s
Area-P 1/2: 5.5 cm2
S' Lateral: 2.65 cm

## 2024-08-11 NOTE — Progress Notes (Unsigned)
 Cardiology Office Note:  .   Date:  08/14/2024  ID:  Connie [name], DOB 1980-11-12, MRN [id number] PCP: Patient, No Pcp Per  [facility] Providers Cardiologist:  Newman JINNY Lawrence, MD {  History of Present Illness: .   Connie [name] is a 43 y.o. female with history of type 1 diabetes, hyperlipidemia.     Chest pain 06/2024 cardio referral.  Coronary CTA and echocardiogram planned. 07/2024 echocardiogram normal.  Coronary CTA pending  Social history  Originally from [place].  Works as water quality scientist at [employer]. No history of tobacco, occasional drinks, no drug use. Unknown family history of CAD.      Patient is new to cardiology and recently referred for complaints of shortness of breath with physical activity and intermittent complaints of chest pain.  Previously seen at the ED with overall negative workup and possible diagnosis of GERD.  After visit on 06/2024 echocardiogram was ordered which was normal.  BNP normal.  Coronary CTA was supposed to be ordered but does not look like that was ever ordered.  They were started on aspirin  and statin.  Of note did not tolerate metoprolol in the past due to dizziness.  Today patient presents for follow-up.  Not much has changed, we went over her echocardiogram results.  She continues to report intermittent chest pain that has been going on for years now.  She reports nonexertional, back pain that radiates to her front side of her chest, reports it feels like a cramping sensation.  No radiation to her neck or jaw.  Denied any exertional component.  Denied any symptoms associated with food.  She told her endocrinologist about this and referred her to GI, reportedly she has EGD pending.  ROS: Denies: shortness of breath, orthopnea, peripheral edema, palpitations, decreased exercise intolerance, fatigue, lightheadedness.   Studies Reviewed: .         Risk Assessment/Calculations:             Physical Exam:   VS:  BP 112/74    Pulse 94   Ht 5' 10 (1.778 m)   Wt 154 lb (69.9 kg)   SpO2 98%   BMI 22.10 kg/m    Wt Readings from Last 3 Encounters:  08/14/24 154 lb (69.9 kg)  07/03/24 156 lb (70.8 kg)  04/30/24 154 lb (69.9 kg)    GEN: Well nourished, well developed in no acute distress NECK: No JVD; No carotid bruits CARDIAC: RRR, no murmurs, rubs, gallops RESPIRATORY:  Clear to auscultation without rales, wheezing or rhonchi  ABDOMEN: Soft, non-tender, non-distended EXTREMITIES:  No edema; No deformity   ASSESSMENT AND PLAN: .    Chest pain She is reporting nonexertional, nonradiating chest pain that has been going on for years.  Risk factors include hyperlipidemia and type 1 diabetes.  EKG without any acute ST-T wave changes.  Coronary CTA was not ordered last visit, we will order today.  Echocardiogram with preserved LVEF 55 to 60%.  No wall motion abnormalities.  Normal RV. Will reorder coronary CTA Lab work from previous visit was normal.  She has had no other changes so no indications to repeat this now. For now we will continue with aspirin .  Does not tolerate beta-blocker due to dizziness but will give her one-time dose of Toprol-XL 100 mg prior to her scan. LDL 153.  Recently started on rosuvastatin  20 mg.  Tolerating okay.  Depending on coronary CT results we will adjust this accordingly.  At next visit consider repeating lipid panel.  Mitral regurgitation Mild disease noted on echocardiogram.  Repeat echocardiogram in 3 to 5 years or sooner if clinically relevant.  Type 1 diabetes Noted.  Avoid SGLT2 inhibitors.    Dispo: Follow-up in 2 months to go over coronary CTA.  For this visit I have no charged her and we have refunded her co-pay.  I apologized to her for the inconvenience.  Signed, Thom LITTIE Sluder, PA-C

## 2024-08-14 ENCOUNTER — Ambulatory Visit: Admitting: Cardiology

## 2024-08-14 ENCOUNTER — Encounter: Payer: Self-pay | Admitting: Cardiology

## 2024-08-14 ENCOUNTER — Ambulatory Visit (HOSPITAL_COMMUNITY)

## 2024-08-14 VITALS — BP 112/74 | HR 94 | Ht 70.0 in | Wt 154.0 lb

## 2024-08-14 DIAGNOSIS — I340 Nonrheumatic mitral (valve) insufficiency: Secondary | ICD-10-CM

## 2024-08-14 DIAGNOSIS — R079 Chest pain, unspecified: Secondary | ICD-10-CM

## 2024-08-14 DIAGNOSIS — E109 Type 1 diabetes mellitus without complications: Secondary | ICD-10-CM

## 2024-08-14 NOTE — Patient Instructions (Addendum)
 Medication Instructions:  Take 1 of  your Metoprolol tartrate 100 mg  by mouth 90-120 minutes prior to scan *If you need a refill on your cardiac medications before your next appointment, please call your pharmacy*  Lab Work: None  If you have labs (blood work) drawn today and your tests are completely normal, you will receive your results only by: MyChart Message (if you have MyChart) OR A paper copy in the mail If you have any lab test that is abnormal or we need to change your treatment, we will call you to review the results.  Testing/Procedures: Coronary CTA   Your cardiac CT will be scheduled at one of the below locations:   [facility] [address]  [place], [place] [address]  If scheduled at [facility], please arrive at the [facility] Entrance (Entrance C2) of [facility] 30 minutes prior to test start time. You can use the FREE valet parking offered at entrance C (encouraged to control the heart rate for the test)  Proceed to the [facility] Radiology Department (first floor) to check-in and test prep.  All radiology patients and guests should use entrance C2 at [facility], accessed from [facility], even though the hospital's physical address listed is [address]  If scheduled at the Heart and Vascular Tower at [address], please enter the parking lot using the [address] entrance and use the FREE valet service at the patient drop-off area. Enter the building and check-in with registration on the main floor.  If scheduled at [facility], please arrive to the Heart and Vascular Center 15 mins early for check-in and test prep.  There is spacious parking and easy access to the radiology department from the [facility] entrance. Please enter here and check-in with the desk attendant.   If scheduled at [facility], please arrive 30  minutes early for check-in and test prep.  Please follow these instructions carefully (unless otherwise directed):  An IV will be required for this test and Nitroglycerin  will be given.  Hold all erectile dysfunction medications at least 3 days (72 hrs) prior to test. (Ie viagra, cialis, sildenafil, tadalafil, etc)   On the Night Before the Test: Be sure to Drink plenty of water. Do not consume any caffeinated/decaffeinated beverages or chocolate 12 hours prior to your test. Do not take any antihistamines 12 hours prior to your test.  If the patient has contrast allergy: Patient will need a prescription for Prednisone and very clear instructions (as follows): Prednisone 50 mg - take 13 hours prior to test Take another Prednisone 50 mg 7 hours prior to test Take another Prednisone 50 mg 1 hour prior to test Take Benadryl  50 mg 1 hour prior to test Patient must complete all four doses of above prophylactic medications. Patient will need a ride after test due to Benadryl .  On the Day of the Test: Drink plenty of water until 1 hour prior to the test. Do not eat any food 1 hour prior to test. You may take your regular medications prior to the test.  Take metoprolol (Lopressor) two hours prior to test. If you take Furosemide/Hydrochlorothiazide/Spironolactone/Chlorthalidone, please HOLD on the morning of the test. Patients who wear a continuous glucose monitor MUST remove the device prior to scanning. FEMALES- please wear underwire-free bra if available, avoid dresses & tight clothing      After the Test: Drink  plenty of water. After receiving IV contrast, you may experience a mild flushed feeling. This is normal. On occasion, you may experience a mild rash up to 24 hours after the test. This is not dangerous. If this occurs, you can take Benadryl  25 mg, Zyrtec, Claritin, or Allegra and increase your fluid intake. (Patients taking Tikosyn should avoid Benadryl , and may take Zyrtec,  Claritin, or Allegra) If you experience trouble breathing, this can be serious. If it is severe call 911 IMMEDIATELY. If it is mild, please call our office.  We will call to schedule your test 2-4 weeks out understanding that some insurance companies will need an authorization prior to the service being performed.   For more information and frequently asked questions, please visit our website : [email or web address]  For non-scheduling related questions, please contact the cardiac imaging nurse navigator should you have any questions/concerns: Cardiac Imaging Nurse Navigators Direct Office Dial: [phone]   For scheduling needs, including cancellations and rescheduling, please call Brittany, [phone].   Follow-Up: At [facility], you and your health needs are our priority.  As part of our continuing mission to provide you with exceptional heart care, our providers are all part of one team.  This team includes your primary Cardiologist (physician) and Advanced Practice Providers or APPs (Physician Assistants and Nurse Practitioners) who all work together to provide you with the care you need, when you need it.  Your next appointment:   2 month(s)  Provider:   Thom Sluder, PA-C        Other Instructions None

## 2024-08-26 ENCOUNTER — Encounter (HOSPITAL_COMMUNITY): Payer: Self-pay

## 2024-08-29 ENCOUNTER — Ambulatory Visit (HOSPITAL_COMMUNITY)
Admission: RE | Admit: 2024-08-29 | Discharge: 2024-08-29 | Disposition: A | Discharge status: Home / Self Care | Source: Ambulatory Visit | Attending: Cardiology | Admitting: Cardiology

## 2024-08-29 DIAGNOSIS — R079 Chest pain, unspecified: Secondary | ICD-10-CM | POA: Diagnosis present

## 2024-08-29 MED ORDER — IOHEXOL 350 MG/ML SOLN
100.0000 mL | Freq: Once | INTRAVENOUS | Status: AC | PRN
  Administered 2024-08-29: 100 mL via INTRAVENOUS

## 2024-08-29 MED ORDER — NITROGLYCERIN 0.4 MG SL SUBL
0.8000 mg | SUBLINGUAL_TABLET | Freq: Once | SUBLINGUAL | Status: AC
  Administered 2024-08-29: 0.8 mg via SUBLINGUAL

## 2024-09-01 ENCOUNTER — Ambulatory Visit: Payer: Self-pay | Admitting: Cardiology

## 2024-09-01 NOTE — Progress Notes (Signed)
 Called and spoke with patient regarding her recent CTA results. Patient verbalized understanding.

## 2024-09-17 NOTE — Telephone Encounter (Signed)
 Spoke with pt regarding test results. Pt verbalized understanding. Pt had no further questions.

## 2024-11-06 ENCOUNTER — Ambulatory Visit: Admitting: Cardiology
